# Patient Record
Sex: Female | Born: 1975 | Race: Black or African American | Marital: Married | Smoking: Never smoker
Health system: Southern US, Community
[De-identification: ages and names within clinical notes are randomized; demographics above are authoritative.]

## PROBLEM LIST (undated history)

## (undated) DIAGNOSIS — I10 Essential (primary) hypertension: Secondary | ICD-10-CM

## (undated) DIAGNOSIS — E119 Type 2 diabetes mellitus without complications: Secondary | ICD-10-CM

---

## 2018-01-28 ENCOUNTER — Emergency Department
Admission: EM | Admit: 2018-01-28 | Discharge: 2018-01-28 | Disposition: A | Discharge status: Home / Self Care | Payer: Self-pay | Attending: Emergency Medicine | Admitting: Emergency Medicine

## 2018-01-28 ENCOUNTER — Other Ambulatory Visit: Payer: Self-pay

## 2018-01-28 ENCOUNTER — Encounter: Payer: Self-pay | Admitting: Emergency Medicine

## 2018-01-28 ENCOUNTER — Emergency Department: Payer: Self-pay

## 2018-01-28 DIAGNOSIS — Z880 Allergy status to penicillin: Secondary | ICD-10-CM | POA: Insufficient documentation

## 2018-01-28 DIAGNOSIS — R1032 Left lower quadrant pain: Secondary | ICD-10-CM | POA: Insufficient documentation

## 2018-01-28 DIAGNOSIS — R112 Nausea with vomiting, unspecified: Secondary | ICD-10-CM | POA: Insufficient documentation

## 2018-01-28 LAB — BASIC METABOLIC PANEL
Anion gap: 9 (ref 5–15)
BUN: 7 mg/dL (ref 6–20)
CO2: 26 mmol/L (ref 22–32)
Calcium: 9.7 mg/dL (ref 8.9–10.3)
Chloride: 101 mmol/L (ref 101–111)
Creatinine, Ser: 0.75 mg/dL (ref 0.44–1.00)
GFR calc Af Amer: >60 mL/min (ref >60)
Glucose, Bld: 204 mg/dL — ABNORMAL HIGH (ref 65–99)
POTASSIUM: 4.1 mmol/L (ref 3.5–5.1)
SODIUM: 136 mmol/L (ref 135–145)

## 2018-01-28 LAB — URINALYSIS, COMPLETE (UACMP) WITH MICROSCOPIC
BACTERIA UA: NONE SEEN
Bilirubin Urine: NEGATIVE
Glucose, UA: 50 mg/dL — AB
HGB URINE DIPSTICK: NEGATIVE
Ketones, ur: NEGATIVE mg/dL
Leukocytes, UA: NEGATIVE
NITRITE: NEGATIVE
PROTEIN: NEGATIVE mg/dL
Specific Gravity, Urine: 1.016 (ref 1.005–1.030)
pH: 6 (ref 5.0–8.0)

## 2018-01-28 LAB — CBC
HEMATOCRIT: 45.3 % (ref 35.0–47.0)
Hemoglobin: 15.2 g/dL (ref 12.0–16.0)
MCH: 30.4 pg (ref 26.0–34.0)
MCHC: 33.6 g/dL (ref 32.0–36.0)
MCV: 90.6 fL (ref 80.0–100.0)
PLATELETS: 266 10*3/uL (ref 150–440)
RBC: 5 MIL/uL (ref 3.80–5.20)
RDW: 13.8 % (ref 11.5–14.5)
WBC: 4.9 10*3/uL (ref 3.6–11.0)

## 2018-01-28 MED ORDER — MORPHINE SULFATE (PF) 4 MG/ML IV SOLN
INTRAVENOUS | Status: AC
  Administered 2018-01-28: 4 mg via INTRAVENOUS
  Filled 2018-01-28: qty 1

## 2018-01-28 MED ORDER — ONDANSETRON HCL 4 MG/2ML IJ SOLN
4.0000 mg | Freq: Once | INTRAMUSCULAR | Status: AC
  Administered 2018-01-28: 4 mg via INTRAVENOUS
  Filled 2018-01-28: qty 2

## 2018-01-28 MED ORDER — SODIUM CHLORIDE 0.9 % IV SOLN
1000.0000 mL | Freq: Once | INTRAVENOUS | Status: AC
  Administered 2018-01-28: 1000 mL via INTRAVENOUS

## 2018-01-28 MED ORDER — KETOROLAC TROMETHAMINE 30 MG/ML IJ SOLN
30.0000 mg | Freq: Once | INTRAMUSCULAR | Status: AC
  Administered 2018-01-28: 30 mg via INTRAVENOUS
  Filled 2018-01-28: qty 1

## 2018-01-28 MED ORDER — FENTANYL CITRATE (PF) 100 MCG/2ML IJ SOLN
50.0000 ug | Freq: Once | INTRAMUSCULAR | Status: AC
  Administered 2018-01-28: 50 ug via INTRAVENOUS

## 2018-01-28 MED ORDER — ONDANSETRON HCL 4 MG/2ML IJ SOLN
4.0000 mg | Freq: Once | INTRAMUSCULAR | Status: AC
  Administered 2018-01-28: 4 mg via INTRAVENOUS

## 2018-01-28 MED ORDER — TRAMADOL HCL 50 MG PO TABS: 50.0000 mg | ORAL_TABLET | Freq: Four times a day (QID) | ORAL | 0 refills | Status: DC | PRN

## 2018-01-28 MED ORDER — MORPHINE SULFATE (PF) 4 MG/ML IV SOLN
4.0000 mg | Freq: Once | INTRAVENOUS | Status: AC
  Administered 2018-01-28: 4 mg via INTRAVENOUS

## 2018-01-28 MED ORDER — ONDANSETRON HCL 4 MG/2ML IJ SOLN
INTRAMUSCULAR | Status: AC
  Filled 2018-01-28: qty 2

## 2018-01-28 MED ORDER — FENTANYL CITRATE (PF) 100 MCG/2ML IJ SOLN
INTRAMUSCULAR | Status: AC
  Administered 2018-01-28: 50 ug via INTRAVENOUS
  Filled 2018-01-28: qty 2

## 2018-01-28 MED ORDER — SODIUM CHLORIDE 0.9 % IV BOLUS (SEPSIS)
1000.0000 mL | Freq: Once | INTRAVENOUS | Status: AC
  Administered 2018-01-28: 1000 mL via INTRAVENOUS

## 2018-01-28 NOTE — ED Triage Notes (Signed)
Pt reports history of kidney stones reports since yesterday left flank pain, hematuria. Reports been taking ibuprofen no help with pain. Been drinking fluids, no denies vomiting just nausea.

## 2018-01-28 NOTE — ED Provider Notes (Signed)
[facility] Emergency Department Provider Note   ____________________________________________    I have reviewed the triage vital signs and the nursing notes.   HISTORY  Chief Complaint Flank Pain     HPI Penny Carroll is a 42 y.o. female who presents with complaints of left flank pain which began yesterday but it was mild, today while she was getting ready for church became much more severe cramping pain and she developed nausea and vomiting.  She does report a history of kidney stones but that was over 8 years ago.  Denies fevers or chills.  No dysuria.  Did notice some hematuria today.  Has not taken anything for this  History reviewed. No pertinent past medical history.  There are no active problems to display for this patient.   History reviewed. No pertinent surgical history.  Prior to Admission medications   Medication Sig Start Date End Date Taking? Authorizing Provider  traMADol (ULTRAM) 50 MG tablet Take 1 tablet (50 mg total) by mouth every 6 (six) hours as needed. 01/28/18 01/28/19  Jene EveryKinner, [place]as[place] Yepiz, MD     Allergies Penicillins and Reglan [metoclopramide]  No family history on file.  Social History Social History   Tobacco Use  . Smoking status: Not on file  Substance Use Topics  . Alcohol use: No    Frequency: Never  . Drug use: No    Review of Systems  Constitutional: No fever/chills Eyes: No visual changes.  ENT: No sore throat. Cardiovascular: Denies chest pain. Respiratory: Denies shortness of breath. Gastrointestinal: Left flank pain, nausea and vomiting as above Genitourinary: Negative for dysuria.  Positive hematuria Musculoskeletal: Negative for back pain. Skin: Negative for rash. Neurological: Negative for headaches   ____________________________________________   PHYSICAL EXAM:  VITAL SIGNS: ED Triage Vitals  Enc Vitals Group     BP 01/28/18 1358 (!) 150/96     Pulse Rate 01/28/18 1358 91     Resp  01/28/18 1358 18     Temp 01/28/18 1358 98.3 F (36.8 C)     Temp Source 01/28/18 1358 Oral     SpO2 01/28/18 1358 100 %     Weight 01/28/18 1427 95.3 kg (210 lb)     Height 01/28/18 1427 1.651 m (5\' 5" )     Head Circumference --      Peak Flow --      Pain Score 01/28/18 1427 10     Pain Loc --      Pain Edu? --      Excl. in GC? --     Constitutional: Alert and oriented.  Eyes: Conjunctivae are normal.   Nose: No congestion/rhinnorhea. Mouth/Throat: Mucous membranes are moist.    Cardiovascular: Normal rate, regular rhythm. Grossly normal heart sounds.  Good peripheral circulation. Respiratory: Normal respiratory effort.  No retractions. Lungs CTAB. Gastrointestinal: Mild tenderness to palpation in the left lower quadrant. No distention.  No CVA tenderness. Genitourinary: deferred Musculoskeletal:  Warm and well perfused Neurologic:  Normal speech and language. No gross focal neurologic deficits are appreciated.  Skin:  Skin is warm, dry and intact. No rash noted. Psychiatric: Mood and affect are normal. Speech and behavior are normal.  ____________________________________________   LABS (all labs ordered are listed, but only abnormal results are displayed)  Labs Reviewed  URINALYSIS, COMPLETE (UACMP) WITH MICROSCOPIC - Abnormal; Notable for the following components:      Result Value   Color, Urine YELLOW (*)    APPearance CLEAR (*)  Glucose, UA 50 (*)    Squamous Epithelial / LPF 0-5 (*)    All other components within normal limits  BASIC METABOLIC PANEL - Abnormal; Notable for the following components:   Glucose, Bld 204 (*)    All other components within normal limits  CBC   ____________________________________________  EKG  None ____________________________________________  RADIOLOGY  CT renal stone study shows no kidney stone, no diverticulitis ____________________________________________   PROCEDURES  Procedure(s) performed:  No  Procedures   Critical Care performed: No ____________________________________________   INITIAL IMPRESSION / ASSESSMENT AND PLAN / ED COURSE  Pertinent labs & imaging results that were available during my care of the patient were reviewed by me and considered in my medical decision making (see chart for details).  Patient presents with left flank pain with reports of hematuria.  Differential diagnosis includes ureterolithiasis, UTI, pyelonephritis, diverticulitis.  We will give IV Toradol, check labs, give IV Zofran and obtain CT renal stone study  Patient continued to have pain after Toradol, given IV morphine x2  ----------------------------------------- 5:26 PM on 01/28/2018 -----------------------------------------  Patient reports brief improvement with morphine for about 10-15 minutes and then her pain returns.  Will try IV fentanyl, discussed with her admission for abdominal pain.  Discussed CT results including incidental findings and the need for close follow-up of these.  After the IV fentanyl patient felt significant better and opted to go home.  She knows she can return anytime if her pain worsens    ____________________________________________   FINAL CLINICAL IMPRESSION(S) / ED DIAGNOSES  Final diagnoses:  Left lower quadrant pain        Note:  This document was prepared using Dragon voice recognition software and may include unintentional dictation errors.    Jene Every, MD 01/28/18 2019

## 2018-01-28 NOTE — ED Notes (Signed)
E sign pad did not capture, patient verbalized understanding.

## 2018-01-28 NOTE — ED Notes (Signed)
Patient given cracker and peanut butter and ginger ale per MD. Patient ambulatory to restroom to obtain urine sample.

## 2018-01-30 ENCOUNTER — Other Ambulatory Visit: Payer: Self-pay

## 2018-01-30 ENCOUNTER — Inpatient Hospital Stay
Admission: EM | Admit: 2018-01-30 | Discharge: 2018-02-03 | DRG: 392 | Disposition: A | Discharge status: Home / Self Care | Payer: Self-pay | Attending: Internal Medicine | Admitting: Internal Medicine

## 2018-01-30 ENCOUNTER — Encounter: Payer: Self-pay | Admitting: Emergency Medicine

## 2018-01-30 ENCOUNTER — Emergency Department: Payer: Self-pay

## 2018-01-30 DIAGNOSIS — K5900 Constipation, unspecified: Secondary | ICD-10-CM | POA: Diagnosis present

## 2018-01-30 DIAGNOSIS — R319 Hematuria, unspecified: Secondary | ICD-10-CM | POA: Diagnosis present

## 2018-01-30 DIAGNOSIS — M79604 Pain in right leg: Secondary | ICD-10-CM | POA: Diagnosis not present

## 2018-01-30 DIAGNOSIS — E785 Hyperlipidemia, unspecified: Secondary | ICD-10-CM | POA: Diagnosis present

## 2018-01-30 DIAGNOSIS — R109 Unspecified abdominal pain: Secondary | ICD-10-CM | POA: Diagnosis present

## 2018-01-30 DIAGNOSIS — I10 Essential (primary) hypertension: Secondary | ICD-10-CM | POA: Diagnosis present

## 2018-01-30 DIAGNOSIS — E669 Obesity, unspecified: Secondary | ICD-10-CM | POA: Diagnosis present

## 2018-01-30 DIAGNOSIS — Z880 Allergy status to penicillin: Secondary | ICD-10-CM

## 2018-01-30 DIAGNOSIS — R1012 Left upper quadrant pain: Principal | ICD-10-CM

## 2018-01-30 DIAGNOSIS — K589 Irritable bowel syndrome without diarrhea: Secondary | ICD-10-CM | POA: Diagnosis present

## 2018-01-30 DIAGNOSIS — K640 First degree hemorrhoids: Secondary | ICD-10-CM | POA: Diagnosis present

## 2018-01-30 DIAGNOSIS — F17210 Nicotine dependence, cigarettes, uncomplicated: Secondary | ICD-10-CM | POA: Diagnosis present

## 2018-01-30 DIAGNOSIS — E119 Type 2 diabetes mellitus without complications: Secondary | ICD-10-CM | POA: Diagnosis present

## 2018-01-30 DIAGNOSIS — R10A2 Flank pain, left side: Secondary | ICD-10-CM | POA: Diagnosis present

## 2018-01-30 DIAGNOSIS — Z794 Long term (current) use of insulin: Secondary | ICD-10-CM

## 2018-01-30 DIAGNOSIS — Z888 Allergy status to other drugs, medicaments and biological substances status: Secondary | ICD-10-CM

## 2018-01-30 DIAGNOSIS — Z6834 Body mass index (BMI) 34.0-34.9, adult: Secondary | ICD-10-CM

## 2018-01-30 HISTORY — DX: Type 2 diabetes mellitus without complications: E11.9

## 2018-01-30 HISTORY — DX: Essential (primary) hypertension: I10

## 2018-01-30 LAB — URINALYSIS, COMPLETE (UACMP) WITH MICROSCOPIC
Bacteria, UA: NONE SEEN
Bilirubin Urine: NEGATIVE
GLUCOSE, UA: NEGATIVE mg/dL
Hgb urine dipstick: NEGATIVE
Ketones, ur: NEGATIVE mg/dL
Leukocytes, UA: NEGATIVE
Nitrite: NEGATIVE
PROTEIN: NEGATIVE mg/dL
Specific Gravity, Urine: 1.009 (ref 1.005–1.030)
pH: 6 (ref 5.0–8.0)

## 2018-01-30 LAB — LIPASE, BLOOD: Lipase: 19 U/L (ref 11–51)

## 2018-01-30 LAB — COMPREHENSIVE METABOLIC PANEL
ALBUMIN: 3.9 g/dL (ref 3.5–5.0)
ALK PHOS: 77 U/L (ref 38–126)
ALT: 22 U/L (ref 14–54)
AST: 24 U/L (ref 15–41)
Anion gap: 8 (ref 5–15)
BILIRUBIN TOTAL: 0.6 mg/dL (ref 0.3–1.2)
BUN: 7 mg/dL (ref 6–20)
CALCIUM: 9 mg/dL (ref 8.9–10.3)
CO2: 28 mmol/L (ref 22–32)
Chloride: 101 mmol/L (ref 101–111)
Creatinine, Ser: 0.77 mg/dL (ref 0.44–1.00)
GFR calc Af Amer: >60 mL/min (ref >60)
GFR calc non Af Amer: >60 mL/min (ref >60)
GLUCOSE: 175 mg/dL — AB (ref 65–99)
Potassium: 3.7 mmol/L (ref 3.5–5.1)
Sodium: 137 mmol/L (ref 135–145)
TOTAL PROTEIN: 7.3 g/dL (ref 6.5–8.1)

## 2018-01-30 LAB — CBC
HEMATOCRIT: 37 % (ref 35.0–47.0)
HEMOGLOBIN: 12.6 g/dL (ref 12.0–16.0)
MCH: 30.9 pg (ref 26.0–34.0)
MCHC: 34.2 g/dL (ref 32.0–36.0)
MCV: 90.3 fL (ref 80.0–100.0)
Platelets: 200 10*3/uL (ref 150–440)
RBC: 4.1 MIL/uL (ref 3.80–5.20)
RDW: 13.2 % (ref 11.5–14.5)
WBC: 4.1 10*3/uL (ref 3.6–11.0)

## 2018-01-30 MED ORDER — IOPAMIDOL (ISOVUE-300) INJECTION 61%
30.0000 mL | Freq: Once | INTRAVENOUS | Status: AC | PRN
  Administered 2018-01-30: 30 mL via ORAL
  Filled 2018-01-30: qty 30

## 2018-01-30 MED ORDER — HYDROMORPHONE HCL 1 MG/ML IJ SOLN
1.0000 mg | Freq: Once | INTRAMUSCULAR | Status: AC
  Administered 2018-01-30: 1 mg via INTRAVENOUS
  Filled 2018-01-30: qty 1

## 2018-01-30 MED ORDER — MORPHINE SULFATE (PF) 4 MG/ML IV SOLN
4.0000 mg | Freq: Once | INTRAVENOUS | Status: AC
  Administered 2018-01-30: 4 mg via INTRAVENOUS
  Filled 2018-01-30: qty 1

## 2018-01-30 MED ORDER — GI COCKTAIL ~~LOC~~
ORAL | Status: AC
  Administered 2018-01-30: 30 mL via ORAL
  Filled 2018-01-30: qty 30

## 2018-01-30 MED ORDER — IOPAMIDOL (ISOVUE-370) INJECTION 76%
100.0000 mL | Freq: Once | INTRAVENOUS | Status: AC | PRN
  Administered 2018-01-30: 100 mL via INTRAVENOUS
  Filled 2018-01-30: qty 100

## 2018-01-30 MED ORDER — KETOROLAC TROMETHAMINE 30 MG/ML IJ SOLN
30.0000 mg | Freq: Once | INTRAMUSCULAR | Status: AC
  Administered 2018-01-30: 30 mg via INTRAVENOUS

## 2018-01-30 MED ORDER — ONDANSETRON HCL 4 MG/2ML IJ SOLN
4.0000 mg | Freq: Once | INTRAMUSCULAR | Status: AC
  Administered 2018-01-30: 4 mg via INTRAVENOUS
  Filled 2018-01-30: qty 2

## 2018-01-30 MED ORDER — KETOROLAC TROMETHAMINE 30 MG/ML IJ SOLN
INTRAMUSCULAR | Status: AC
  Filled 2018-01-30: qty 1

## 2018-01-30 MED ORDER — GI COCKTAIL ~~LOC~~
30.0000 mL | Freq: Once | ORAL | Status: AC
  Administered 2018-01-30: 30 mL via ORAL

## 2018-01-30 MED ORDER — SODIUM CHLORIDE 0.9 % IV BOLUS (SEPSIS)
1000.0000 mL | Freq: Once | INTRAVENOUS | Status: AC
  Administered 2018-01-30: 1000 mL via INTRAVENOUS

## 2018-01-30 NOTE — ED Provider Notes (Signed)
[facility] Emergency Department Provider Note  Time seen: 5:45 PM  I have reviewed the triage vital signs and the nursing notes.   HISTORY  Chief Complaint Hematuria    HPI Penny Carroll is a 42 y.o. female who presents to the emergency department for 2-3 days of worsening left-sided abdominal pain.  According to the patient for the past 2 days she has been experiencing severe left abdominal pain which has progressively worsened.  Patient was seen in the emergency department 2 days ago for the same had an overall normal workup including a noncontrasted CT and lab work.  Did have incidental findings on the CT.  States the pain has progressively worsened since being discharged home, she is very nauseated but denies any vomiting.  Denies diarrhea, last bowel movement was this morning.  States low-grade fever of 99 at home.  States she noticed some blood in her urine but denies any pain with urination denies vaginal bleeding, denies black or bloody stool.   History reviewed. No pertinent past medical history.  There are no active problems to display for this patient.   History reviewed. No pertinent surgical history.  Prior to Admission medications   Medication Sig Start Date End Date Taking? Authorizing Provider  traMADol (ULTRAM) 50 MG tablet Take 1 tablet (50 mg total) by mouth every 6 (six) hours as needed. 01/28/18 01/28/19  Jene Every, MD    Allergies  Allergen Reactions  . Penicillins   . Reglan [Metoclopramide]     No family history on file.  Social History Social History   Tobacco Use  . Smoking status: Never Smoker  . Smokeless tobacco: Never Used  Substance Use Topics  . Alcohol use: No    Frequency: Never  . Drug use: No    Review of Systems Constitutional: States low-grade fever at home Eyes: Negative for visual complaints ENT: Negative for recent illness/congestion Cardiovascular: Negative for chest pain. Respiratory: Negative for  shortness of breath. Gastrointestinal: Significant left-sided abdominal pain, nausea but no vomiting or diarrhea.  No black or bloody stool. Genitourinary: States had noted some blood in her urine today but only when urinating, no vaginal bleeding, no dysuria. Musculoskeletal: Negative for musculoskeletal complaints Skin: Negative for skin complaints  Neurological: Negative for headache All other ROS negative  ____________________________________________   PHYSICAL EXAM:  VITAL SIGNS: ED Triage Vitals  Enc Vitals Group     BP 01/30/18 1551 (!) 150/118     Pulse Rate 01/30/18 1551 93     Resp 01/30/18 1551 20     Temp 01/30/18 1551 98.8 F (37.1 C)     Temp Source 01/30/18 1551 Oral     SpO2 01/30/18 1551 99 %     Weight --      Height --      Head Circumference --      Peak Flow --      Pain Score 01/30/18 1552 4     Pain Loc --      Pain Edu? --      Excl. in GC? --     Constitutional: Alert and oriented.  Mild distress due to abdominal pain, holding her abdomen. Eyes: Normal exam ENT   Head: Normocephalic and atraumatic   Mouth/Throat: Mucous membranes are moist. Cardiovascular: Normal rate, regular rhythm. No murmur Respiratory: Normal respiratory effort without tachypnea nor retractions. Breath sounds are clear  Gastrointestinal: Soft, moderate tenderness to palpation left upper and lower quadrants.  No rebound or guarding.  No distention. Musculoskeletal: Nontender with normal range of motion in all extremities.  Neurologic:  Normal speech and language. No gross focal neurologic deficits  Skin:  Skin is warm, dry and intact.  Psychiatric: Mood and affect are normal.   ____________________________________________     RADIOLOGY  CT scan essentially normal.  ____________________________________________   INITIAL IMPRESSION / ASSESSMENT AND PLAN / ED COURSE  Pertinent labs & imaging results that were available during my care of the patient were  reviewed by me and considered in my medical decision making (see chart for details).  Patient presents the emergency department for left-sided abdominal pain worsening over the past 2-3 days.  Was seen in the emergency department 2 days ago for the same with a largely normal workup besides incidental findings.  Differential would include colitis, diverticulitis, ureterolithiasis, pyelonephritis.  We will recheck labs today in the emergency department, repeat a urinalysis, obtain a contrasted oral and IV CT scan to further evaluate.  We will treat pain and nausea while awaiting results.  Patient agreeable to this plan of care.  Labs have resulted within normal limits.  CT scan is normal.  Patient continues to state severe pain.  I repeated my abdominal exam all of her pain is in the left upper quadrant to left mid abdomen.  I do not have a clear cause for the patient's discomfort she does continue to appear uncomfortable.  I reviewed the patient's narcotic database, only one prescription filled recently for tramadol.  We will attempt a GI cocktail for symptom relief for possible gastritis although the patient is quite tender to palpation.  It is reassuring that the patient's labs are normal given her pain is been ongoing for 3 days but I do not have a clear cause for the patient's discomfort at this time.  Patient continues with significant pain despite GI cocktail.  No clear cause for the pain but continues to be severely uncomfortable.  We will admit to the hospitalist service for further treatment and monitoring.  ____________________________________________   FINAL CLINICAL IMPRESSION(S) / ED DIAGNOSES  Left-sided abdominal pain    Minna AntisPaduchowski, Alekxander Isola, MD 01/30/18 2228

## 2018-01-30 NOTE — ED Triage Notes (Signed)
Pt with blood in  Her urine and left side flank pain.

## 2018-01-31 ENCOUNTER — Inpatient Hospital Stay: Payer: Self-pay

## 2018-01-31 ENCOUNTER — Other Ambulatory Visit: Payer: Self-pay

## 2018-01-31 DIAGNOSIS — R195 Other fecal abnormalities: Secondary | ICD-10-CM

## 2018-01-31 LAB — HEMOGLOBIN A1C
Hgb A1c MFr Bld: 10.8 % — ABNORMAL HIGH (ref 4.8–5.6)
Mean Plasma Glucose: 263.26 mg/dL

## 2018-01-31 LAB — CBC
HEMATOCRIT: 39.1 % (ref 35.0–47.0)
HEMOGLOBIN: 11.6 g/dL — AB (ref 12.0–16.0)
MCH: 27.5 pg (ref 26.0–34.0)
MCHC: 29.6 g/dL — AB (ref 32.0–36.0)
MCV: 92.7 fL (ref 80.0–100.0)
Platelets: 171 10*3/uL (ref 150–440)
RBC: 4.22 MIL/uL (ref 3.80–5.20)
RDW: 13.6 % (ref 11.5–14.5)
WBC: 3.3 10*3/uL — ABNORMAL LOW (ref 3.6–11.0)

## 2018-01-31 LAB — GLUCOSE, CAPILLARY
GLUCOSE-CAPILLARY: 235 mg/dL — AB (ref 65–99)
GLUCOSE-CAPILLARY: 179 mg/dL — AB (ref 65–99)
GLUCOSE-CAPILLARY: 215 mg/dL — AB (ref 65–99)
Glucose-Capillary: 211 mg/dL — ABNORMAL HIGH (ref 65–99)
Glucose-Capillary: 241 mg/dL — ABNORMAL HIGH (ref 65–99)

## 2018-01-31 LAB — BASIC METABOLIC PANEL
ANION GAP: 10 (ref 5–15)
BUN: 8 mg/dL (ref 6–20)
CO2: 22 mmol/L (ref 22–32)
Calcium: 8.5 mg/dL — ABNORMAL LOW (ref 8.9–10.3)
Chloride: 105 mmol/L (ref 101–111)
Creatinine, Ser: 0.74 mg/dL (ref 0.44–1.00)
GFR calc Af Amer: >60 mL/min (ref >60)
GLUCOSE: 271 mg/dL — AB (ref 65–99)
Potassium: 3.6 mmol/L (ref 3.5–5.1)
Sodium: 137 mmol/L (ref 135–145)

## 2018-01-31 LAB — FERRITIN: Ferritin: 71 ng/mL (ref 11–307)

## 2018-01-31 LAB — FOLATE: Folate: 12.5 ng/mL (ref >5.9)

## 2018-01-31 LAB — IRON AND TIBC
Iron: 51 ug/dL (ref 28–170)
SATURATION RATIOS: 14 % (ref 10.4–31.8)
TIBC: 373 ug/dL (ref 250–450)
UIBC: 322 ug/dL

## 2018-01-31 LAB — RETICULOCYTES
RBC.: 4.59 MIL/uL (ref 3.80–5.20)
RETIC CT PCT: 1.1 % (ref 0.4–3.1)
Retic Count, Absolute: 50.5 10*3/uL (ref 19.0–183.0)

## 2018-01-31 MED ORDER — LORAZEPAM 2 MG/ML IJ SOLN
2.0000 mg | Freq: Once | INTRAMUSCULAR | Status: AC
  Administered 2018-01-31: 11:00:00 2 mg via INTRAVENOUS
  Filled 2018-01-31: qty 1

## 2018-01-31 MED ORDER — ONDANSETRON HCL 4 MG PO TABS: 4.0000 mg | ORAL_TABLET | Freq: Four times a day (QID) | ORAL | Status: DC | PRN

## 2018-01-31 MED ORDER — DOCUSATE SODIUM 100 MG PO CAPS
100.0000 mg | ORAL_CAPSULE | Freq: Two times a day (BID) | ORAL | Status: DC
  Administered 2018-01-31 (×2): 100 mg via ORAL
  Filled 2018-01-31 (×5): qty 1

## 2018-01-31 MED ORDER — ACETAMINOPHEN 325 MG PO TABS: 650.0000 mg | ORAL_TABLET | Freq: Four times a day (QID) | ORAL | Status: DC | PRN

## 2018-01-31 MED ORDER — ONDANSETRON HCL 4 MG/2ML IJ SOLN
4.0000 mg | Freq: Four times a day (QID) | INTRAMUSCULAR | Status: DC | PRN
Start: 2018-01-31 — End: 2018-02-03
  Administered 2018-02-01: 18:00:00 4 mg via INTRAVENOUS
  Filled 2018-01-31: qty 2

## 2018-01-31 MED ORDER — MORPHINE SULFATE (PF) 2 MG/ML IV SOLN
2.0000 mg | Freq: Once | INTRAVENOUS | Status: AC
  Administered 2018-01-31: 2 mg via INTRAVENOUS
  Filled 2018-01-31: qty 1

## 2018-01-31 MED ORDER — PANTOPRAZOLE SODIUM 40 MG PO TBEC
40.0000 mg | DELAYED_RELEASE_TABLET | Freq: Two times a day (BID) | ORAL | Status: DC
  Administered 2018-01-31 – 2018-02-02 (×3): 40 mg via ORAL
  Filled 2018-01-31 (×3): qty 1

## 2018-01-31 MED ORDER — TRAZODONE HCL 50 MG PO TABS
25.0000 mg | ORAL_TABLET | Freq: Every evening | ORAL | Status: DC | PRN
  Filled 2018-01-31: qty 1

## 2018-01-31 MED ORDER — SODIUM CHLORIDE 0.9 % IV SOLN
Freq: Once | INTRAVENOUS | Status: AC
  Administered 2018-01-31: 01:00:00 via INTRAVENOUS

## 2018-01-31 MED ORDER — HYDROCODONE-ACETAMINOPHEN 5-325 MG PO TABS
1.0000 | ORAL_TABLET | ORAL | Status: DC | PRN
  Administered 2018-01-31: 2 via ORAL
  Filled 2018-01-31: qty 2

## 2018-01-31 MED ORDER — PANTOPRAZOLE SODIUM 40 MG PO TBEC
40.0000 mg | DELAYED_RELEASE_TABLET | Freq: Every day | ORAL | Status: DC
  Administered 2018-01-31: 40 mg via ORAL
  Filled 2018-01-31: qty 1

## 2018-01-31 MED ORDER — MORPHINE SULFATE (PF) 2 MG/ML IV SOLN
2.0000 mg | INTRAVENOUS | Status: DC | PRN
  Administered 2018-01-31 (×2): 2 mg via INTRAVENOUS
  Filled 2018-01-31 (×2): qty 1

## 2018-01-31 MED ORDER — INSULIN ASPART 100 UNIT/ML ~~LOC~~ SOLN
0.0000 [IU] | Freq: Three times a day (TID) | SUBCUTANEOUS | Status: DC
  Administered 2018-01-31: 13:00:00 3 [IU] via SUBCUTANEOUS
  Administered 2018-01-31: 2 [IU] via SUBCUTANEOUS
  Administered 2018-01-31 – 2018-02-02 (×3): 3 [IU] via SUBCUTANEOUS
  Administered 2018-02-02: 5 [IU] via SUBCUTANEOUS
  Administered 2018-02-03: 2 [IU] via SUBCUTANEOUS
  Filled 2018-01-31 (×9): qty 1

## 2018-01-31 MED ORDER — HEPARIN SODIUM (PORCINE) 5000 UNIT/ML IJ SOLN
5000.0000 [IU] | Freq: Three times a day (TID) | INTRAMUSCULAR | Status: DC
  Administered 2018-01-31 – 2018-02-02 (×6): 5000 [IU] via SUBCUTANEOUS
  Filled 2018-01-31 (×7): qty 1

## 2018-01-31 MED ORDER — ACETAMINOPHEN 650 MG RE SUPP: 650.0000 mg | Freq: Four times a day (QID) | RECTAL | Status: DC | PRN

## 2018-01-31 MED ORDER — BISACODYL 5 MG PO TBEC: 5.0000 mg | DELAYED_RELEASE_TABLET | Freq: Every day | ORAL | Status: DC | PRN

## 2018-01-31 MED ORDER — INSULIN ASPART 100 UNIT/ML ~~LOC~~ SOLN
0.0000 [IU] | Freq: Every day | SUBCUTANEOUS | Status: DC
  Administered 2018-01-31: 2 [IU] via SUBCUTANEOUS
  Administered 2018-02-01: 3 [IU] via SUBCUTANEOUS
  Filled 2018-01-31 (×2): qty 1

## 2018-01-31 MED ORDER — HYDROMORPHONE HCL 1 MG/ML IJ SOLN
0.5000 mg | INTRAMUSCULAR | Status: DC | PRN
  Administered 2018-01-31 – 2018-02-01 (×7): 0.5 mg via INTRAVENOUS
  Filled 2018-01-31 (×7): qty 1

## 2018-01-31 MED ORDER — KETOROLAC TROMETHAMINE 30 MG/ML IJ SOLN: 30.0000 mg | Freq: Three times a day (TID) | INTRAMUSCULAR | Status: DC | PRN

## 2018-01-31 NOTE — Progress Notes (Signed)
[facility] [facility] [place] [facility]   PATIENT NAME: Penny Carroll    MR#:  [id number]  DATE OF BIRTH:  Aug 15, 1976  SUBJECTIVE:  CHIEF COMPLAINT:   Chief Complaint  Patient presents with  . Hematuria   -Patient came in with 2 day history of left-sided abdominal pain with nausea -Still complains of significant pain  REVIEW OF SYSTEMS:  Review of Systems  Constitutional: Positive for malaise/fatigue. Negative for chills and fever.  HENT: Negative for congestion, ear discharge, hearing loss and nosebleeds.   Eyes: Negative for blurred vision and double vision.  Respiratory: Negative for cough, shortness of breath and wheezing.   Cardiovascular: Negative for chest pain and palpitations.  Gastrointestinal: Positive for abdominal pain and nausea. Negative for constipation, diarrhea and vomiting.  Genitourinary: Negative for dysuria.  Musculoskeletal: Negative for myalgias.  Neurological: Negative for dizziness, speech change, focal weakness, seizures and headaches.  Psychiatric/Behavioral: Negative for depression.    DRUG ALLERGIES:   Allergies  Allergen Reactions  . Penicillins   . Reglan [Metoclopramide]     VITALS:  Blood pressure 121/88, pulse 74, temperature 97.8 F (36.6 C), temperature source Oral, resp. rate 12, height 5\' 5"  (1.651 m), weight 94.3 kg (207 lb 14.4 oz), SpO2 100 %.  PHYSICAL EXAMINATION:  Physical Exam  GENERAL:  42 y.o.-year-old patient lying in the bed with no acute distress.  EYES: Pupils equal, round, reactive to light and accommodation. No scleral icterus. Extraocular muscles intact.  HEENT: Head atraumatic, normocephalic. Oropharynx and nasopharynx clear.  NECK:  Supple, no jugular venous distention. No thyroid enlargement, no tenderness.  LUNGS: Normal breath sounds bilaterally, no wheezing, rales,rhonchi or crepitation. No use of accessory muscles of respiration.  CARDIOVASCULAR: S1, S2 normal. No murmurs, rubs, or gallops.    ABDOMEN: Soft, tender to touch in the left flank, left mid abdomen-voluntary guarding but no rigidity or rebound tenderness, nondistended. Bowel sounds present. No organomegaly or mass.  EXTREMITIES: No pedal edema, cyanosis, or clubbing.  NEUROLOGIC: Cranial nerves II through XII are intact. Muscle strength 5/5 in all extremities. Sensation intact. Gait not checked.  PSYCHIATRIC: The patient is alert and oriented x 3.  SKIN: No obvious rash, lesion, or ulcer.    LABORATORY PANEL:   CBC Recent Labs  Lab 01/31/18 0358  WBC 3.3*  HGB 11.6*  HCT 39.1  PLT 171   ------------------------------------------------------------------------------------------------------------------  Chemistries  Recent Labs  Lab 01/30/18 1745 01/31/18 0358  NA 137 137  K 3.7 3.6  CL 101 105  CO2 28 22  GLUCOSE 175* 271*  BUN 7 8  CREATININE 0.77 0.74  CALCIUM 9.0 8.5*  AST 24  --   ALT 22  --   ALKPHOS 77  --   BILITOT 0.6  --    ------------------------------------------------------------------------------------------------------------------  Cardiac Enzymes No results for input(s): TROPONINI in the last 168 hours. ------------------------------------------------------------------------------------------------------------------  RADIOLOGY:  Ct Abdomen Pelvis W Wo Contrast  Result Date: 01/30/2018 CLINICAL DATA:  Left-sided flank pain.  Hematuria. EXAM: CT ABDOMEN AND PELVIS WITHOUT AND WITH CONTRAST TECHNIQUE: Multidetector CT imaging of the abdomen and pelvis was performed following the standard protocol before and following the bolus administration of intravenous contrast. CONTRAST:  ISOVUE-370 IOPAMIDOL (ISOVUE-370) INJECTION 76% COMPARISON:  01/28/2018 FINDINGS: Lower chest: No acute abnormality. 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 No other liver masses or lesions. Liver normal in size and attenuation. Gallbladder surgically absent.  No  bile duct  dilation. Pancreas: Unremarkable. No pancreatic ductal dilatation or surrounding inflammatory changes. Spleen: Normal in size without focal abnormality. Adrenals/Urinary Tract: No adrenal masses. Kidneys are normal size, orientation and position. There is symmetric renal enhancement excretion. No renal masses or stones. No hydronephrosis. Ureters normal in course and in caliber. Bladder is minimally distended.  No bladder mass or wall thickening. Stomach/Bowel: Stomach and small bowel are unremarkable. No colonic distention, wall thickening or inflammation. Surgical staples are noted along the medial cecal tip consistent with a prior appendectomy. Vascular/Lymphatic: Aorta is normal in caliber. No significant atherosclerosis. No dissection. Major branch vessels are widely patent. No enlarged lymph nodes. Reproductive: Heterogeneous attenuation the uterus. Uterus is normal in overall size. Contour bulge noted from the uterine fundus consistent with a small subserosal fibroid. No adnexal masses. Other: There has been a previous low anterior midline incision. A fat containing umbilical hernia is noted. No bowel enters this. There are no other hernias. No ascites. Musculoskeletal: No acute or significant osseous findings. IMPRESSION: 1. No acute findings within the abdomen or pelvis. Specifically, no findings to account for left flank pain or hematuria. 2. Previously seen low-density mass at the dome of the right liver lobe is a hemangioma. No further evaluation indicated. 3. No renal or ureteral stones. No hydronephrosis. No renal masses. Bladder is only minimally distended. No evidence of a bladder mass or significant wall thickening. 4. Small uterine fibroids. 5. Fat containing umbilical hernia. Electronically Signed   By: Amie Portlandavid  Ormond M.D.   On: 01/30/2018 20:40   Mr Thoracic Spine Wo Contrast  Result Date: 01/31/2018 CLINICAL DATA:  Acute intractable left upper quadrant pain. Radiculopathy. EXAM: MRI  THORACIC SPINE WITHOUT CONTRAST TECHNIQUE: Multiplanar, multisequence MR imaging of the thoracic spine was performed. No intravenous contrast was administered. COMPARISON:  CT scan of the abdomen dated 01/30/2018 FINDINGS: Alignment:  Physiologic. Vertebrae: No fracture, evidence of discitis, or bone lesion. Cord:  Normal signal and morphology. Paraspinal and other soft tissues: Negative. Disc levels: T1-2 through T6-7: Normal. T7-8: Slight disc desiccation with a tiny broad-based disc bulge with no neural impingement. T8-9: Disc desiccation. Tiny central disc bulge with no neural impingement. T9-10: Disc desiccation. Tiny disc protrusion just to the right of midline without neural impingement. T10-11 through T12-L1: Normal. No spinal or foraminal stenosis. No appreciable facet joint disease. IMPRESSION: Slight degenerative disc disease at T7-8 through T9-10 without neural impingement. Otherwise negative thoracic MRI. Electronically Signed   By: Francene BoyersJames  Maxwell M.D.   On: 01/31/2018 12:32    EKG:  No orders found for this or any previous visit.  ASSESSMENT AND PLAN:   42 year old female with past medical history significant for diabetes, hypertension and hyperlipidemia presents to hospital secondary to abdominal pain  1. Left-sided abdominal pain-unknown cause at this time. -Nothing on inspection, tender abdomen on exam, CT of the abdomen with no acute findings. No obstruction. -GI has been consulted. -Continue regular diet if she can tolerate -IV fluids and pain medications. Lipase is within normal limits. -The thoracic spine MRI was ordered to rule out any neurological causes but the pain is very localized and would not explain.  2. Diabetes mellitus-check A1c. Currently only on sliding scale insulin  3. Hypertension-well controlled blood pressure without any medications. Monitor  4. DVT prophylaxis-subcutaneous heparin   All the records are reviewed and case discussed with Care  Management/Social Workerr. Management plans discussed with the patient, family and they are in agreement.  CODE STATUS: Full Code  TOTAL TIME TAKING  CARE OF THIS PATIENT: 38 minutes.   POSSIBLE D/C IN 1-2 DAYS, DEPENDING ON CLINICAL CONDITION.   Brayton Baumgartner M.D on 01/31/2018 at 12:36 PM  Between 7am to 6pm - Pager - [phone]  After 6pm go to [email or web address] - password [email or web address]  [facility]  Office  [phone]  CC: Primary care physician; System, Pcp Not In

## 2018-01-31 NOTE — ED Notes (Signed)
Pt taken to floor via stretcher. Report called to RN. All questions answered.

## 2018-01-31 NOTE — Consult Note (Addendum)
Penny Darby, MD [address]  [place]  [place], [place] [address]  Main: [phone]  Fax: [phone] Pager: [phone]   Consultation  Referring Provider:     No ref. provider found Primary Care Physician:  System, Pcp Not In Primary Gastroenterologist:  Dr. Sherri Sear         Reason for Consultation:     Left upper quadrant pain  Date of Admission:  01/30/2018 Date of Consultation:  01/31/2018         HPI:   Breiona Carroll is a 42 y.o. African-American female with history of obesity, uncontrolled diabetes on insulin who presents with about 5 days history of left upper quadrant pain associated with nausea and nonbloody emesis. She reports that pain started on Saturday, she had 3 episodes of emesis. Lying on left side and on back makes pain worse. She is also noticing black stools since the onset of pain. Other symptoms include early satiety, bloating. Prior to this, patient reports she was having brown bowel movements. In last 3-4 weeks, patient has lost about 4 pounds in the process of attending funeral of her mom who just passed away. Her hemoglobin on admission was 15.2, dropped to 11.6 today. However, all cell counts also dropped most likely secondary to hemodilution. She did not have elevated BUN at the time of admission. She is currently being managed for diabetes. She has smoked for 25+ years, currently smoking. Denies alcohol use She had CT A/P and renal stone protocol on admission which was unremarkable. Lipase and LFTs were normal. She started on Protonix daily, received GI cocktail, and pain medication  NSAIDs: None  Antiplts/Anticoagulants/Anti thrombotics: None  GI Procedures: None Mother with history of colon cancer around age 41, passed away at age 78  Past Medical History:  Diagnosis Date  . Diabetes mellitus without complication ([place])   . Hypertension     History reviewed. No pertinent surgical history.  Prior to Admission medications     Medication Sig Start Date End Date Taking? Authorizing Provider  traMADol (ULTRAM) 50 MG tablet Take 1 tablet (50 mg total) by mouth every 6 (six) hours as needed. 01/28/18 01/28/19 Yes Lavonia Drafts, MD    No family history on file.   Social History   Tobacco Use  . Smoking status: Never Smoker  . Smokeless tobacco: Never Used  Substance Use Topics  . Alcohol use: No    Frequency: Never  . Drug use: No    Allergies as of 01/30/2018 - Review Complete 01/30/2018  Allergen Reaction Noted  . Penicillins  01/28/2018  . Reglan [metoclopramide]  01/28/2018    Review of Systems:    All systems reviewed and negative except where noted in HPI.   Physical Exam:  Vital signs in last 24 hours: Temp:  [97.8 F (36.6 C)-98.4 F (36.9 C)] 98.2 F (36.8 C) (02/06 1355) Pulse Rate:  [57-91] 81 (02/06 1355) Resp:  [12-20] 18 (02/06 1355) BP: (111-155)/(76-122) 112/76 (02/06 1355) SpO2:  [97 %-100 %] 100 % (02/06 1355) Weight:  [207 lb 14.4 oz (94.3 kg)] 207 lb 14.4 oz (94.3 kg) (02/06 0049) Last BM Date: 01/30/18 General:   Pleasant, cooperative in NAD Head:  Normocephalic and atraumatic. Eyes:   No icterus.   Conjunctiva pink. PERRLA. Ears:  Normal auditory acuity. Neck:  Supple; no masses or thyroidomegaly Lungs: Respirations even and unlabored. Lungs clear to auscultation bilaterally.   No wheezes, crackles, or rhonchi.  Heart:  Regular rate  and rhythm;  Without murmur, clicks, rubs or gallops Abdomen:  Soft, nondistended, moderate left upper quadrant tenderness. Normal bowel sounds. No appreciable masses or hepatomegaly.  No rebound or guarding.  Rectal:  Not performed. Msk:  Symmetrical without gross deformities.  Strength normal  Extremities:  Without edema, cyanosis or clubbing. Neurologic:  Alert and oriented x3;  grossly normal neurologically. Skin:  Intact without significant lesions or rashes. Cervical Nodes:  No significant cervical adenopathy. Psych:  Alert and  cooperative. Normal affect.  LAB RESULTS: CBC Latest Ref Rng & Units 01/31/2018 01/30/2018 01/28/2018  WBC 3.6 - 11.0 K/uL 3.3(L) 4.1 4.9  Hemoglobin 12.0 - 16.0 g/dL 11.6(L) 12.6 15.2  Hematocrit 35.0 - 47.0 % 39.1 37.0 45.3  Platelets 150 - 440 K/uL 171 200 266    BMET BMP Latest Ref Rng & Units 01/31/2018 01/30/2018 01/28/2018  Glucose 65 - 99 mg/dL 271(H) 175(H) 204(H)  BUN 6 - 20 mg/dL '8 7 7  ' Creatinine 0.44 - 1.00 mg/dL 0.74 0.77 0.75  Sodium 135 - 145 mmol/L 137 137 136  Potassium 3.5 - 5.1 mmol/L 3.6 3.7 4.1  Chloride 101 - 111 mmol/L 105 101 101  CO2 22 - 32 mmol/L '22 28 26  ' Calcium 8.9 - 10.3 mg/dL 8.5(L) 9.0 9.7    LFT Hepatic Function Latest Ref Rng & Units 01/30/2018  Total Protein 6.5 - 8.1 g/dL 7.3  Albumin 3.5 - 5.0 g/dL 3.9  AST 15 - 41 U/L 24  ALT 14 - 54 U/L 22  Alk Phosphatase 38 - 126 U/L 77  Total Bilirubin 0.3 - 1.2 mg/dL 0.6     STUDIES: Ct Abdomen Pelvis W Wo Contrast  Result Date: 01/30/2018 CLINICAL DATA:  Left-sided flank pain.  Hematuria. EXAM: CT ABDOMEN AND PELVIS WITHOUT AND WITH CONTRAST TECHNIQUE: Multidetector CT imaging of the abdomen and pelvis was performed following the standard protocol before and following the bolus administration of intravenous contrast. CONTRAST:  115m ISOVUE-370 IOPAMIDOL (ISOVUE-370) INJECTION 76% COMPARISON:  01/28/2018 FINDINGS: Lower chest: No acute abnormality. 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 No other liver masses or lesions. Liver normal in size and attenuation. Gallbladder surgically absent.  No bile duct dilation. Pancreas: Unremarkable. No pancreatic ductal dilatation or surrounding inflammatory changes. Spleen: Normal in size without focal abnormality. Adrenals/Urinary Tract: No adrenal masses. Kidneys are normal size, orientation and position. There is symmetric renal enhancement excretion. No renal masses or stones. No hydronephrosis. Ureters normal in course and in  caliber. Bladder is minimally distended.  No bladder mass or wall thickening. Stomach/Bowel: Stomach and small bowel are unremarkable. No colonic distention, wall thickening or inflammation. Surgical staples are noted along the medial cecal tip consistent with a prior appendectomy. Vascular/Lymphatic: Aorta is normal in caliber. No significant atherosclerosis. No dissection. Major branch vessels are widely patent. No enlarged lymph nodes. Reproductive: Heterogeneous attenuation the uterus. Uterus is normal in overall size. Contour bulge noted from the uterine fundus consistent with a small subserosal fibroid. No adnexal masses. Other: There has been a previous low anterior midline incision. A fat containing umbilical hernia is noted. No bowel enters this. There are no other hernias. No ascites. Musculoskeletal: No acute or significant osseous findings. IMPRESSION: 1. No acute findings within the abdomen or pelvis. Specifically, no findings to account for left flank pain or hematuria. 2. Previously seen low-density mass at the dome of the right liver lobe is a hemangioma. No further evaluation indicated. 3. No  renal or ureteral stones. No hydronephrosis. No renal masses. Bladder is only minimally distended. No evidence of a bladder mass or significant wall thickening. 4. Small uterine fibroids. 5. Fat containing umbilical hernia. Electronically Signed   By: Lajean Manes M.D.   On: 01/30/2018 20:40   Mr Thoracic Spine Wo Contrast  Result Date: 01/31/2018 CLINICAL DATA:  Acute intractable left upper quadrant pain. Radiculopathy. EXAM: MRI THORACIC SPINE WITHOUT CONTRAST TECHNIQUE: Multiplanar, multisequence MR imaging of the thoracic spine was performed. No intravenous contrast was administered. COMPARISON:  CT scan of the abdomen dated 01/30/2018 FINDINGS: Alignment:  Physiologic. Vertebrae: No fracture, evidence of discitis, or bone lesion. Cord:  Normal signal and morphology. Paraspinal and other soft tissues:  Negative. Disc levels: T1-2 through T6-7: Normal. T7-8: Slight disc desiccation with a tiny broad-based disc bulge with no neural impingement. T8-9: Disc desiccation. Tiny central disc bulge with no neural impingement. T9-10: Disc desiccation. Tiny disc protrusion just to the right of midline without neural impingement. T10-11 through T12-L1: Normal. No spinal or foraminal stenosis. No appreciable facet joint disease. IMPRESSION: Slight degenerative disc disease at T7-8 through T9-10 without neural impingement. Otherwise negative thoracic MRI. Electronically Signed   By: Lorriane Shire M.D.   On: 01/31/2018 12:32      Impression / Plan:   Lucia Harm is a 42 y.o. female with history of insulin-dependent diabetes presents with approximately 5 days history of left upper quadrant pain associated with early satiety, black stools, nausea and emesis. Differentials include peptic ulcer disease or erosive gastropathy or AVMs or proximal colonic bleed or small bowel bleed. She may also have gastroparesis attribute to some of her symptoms with history of uncontrolled diabetes.   - Increase Protonix to 40 mg twice daily - Check ferritin, iron, TIBC, B12 - Nothing by mouth past midnight - EGD tomorrow - If EGD is unremarkable, recommend colonoscopy  I have discussed alternative options, risks & benefits,  which include, but are not limited to, bleeding, infection, perforation,respiratory complication & drug reaction.  The patient agrees with this plan & written consent will be obtained.    Thank you for involving me in the care of this patient.      LOS: 1 day   Sherri Sear, MD  01/31/2018, 6:25 PM   Note: This dictation was prepared with Dragon dictation along with smaller phrase technology. Any transcriptional errors that result from this process are unintentional.

## 2018-01-31 NOTE — ED Notes (Signed)
Hospitalist in room to see patient at this time.

## 2018-01-31 NOTE — H&P (Signed)
[facility] [place] [facility]   PATIENT NAME: Penny FlesherJanae Aldaco    MR#:  [id number]  DATE OF BIRTH:  Nov 26, 1976  DATE OF ADMISSION:  01/30/2018  PRIMARY CARE PHYSICIAN: System, Pcp Not In   REQUESTING/REFERRING PHYSICIAN:   CHIEF COMPLAINT:   Chief Complaint  Patient presents with  . Hematuria    HISTORY OF PRESENT ILLNESS: Penny Carroll  is a 42 y.o. female with a known history of diabetes type 2, diet controlled and hypertension.  Patient presented to emergency room for acute onset of left upper quadrant pain, going on for the past 3 days, gradually getting worse.  The pain is constant, severe, sharp and burning in nature; if follows the rib cage.  The pain seems to be positional, worse with walking; ibuprofen high dose helps minimally.  Patient has had some nausea for the past 24 hours, but no vomiting; no fever/chills; no diarrhea or bleeding.  Patient denies any similar episodes in the past.  She does not recall any recent trauma or fall.  Patient was seen in emergency room for the same reason 2 days ago.  She underwent CAT scan with IV contrast to rule out kidney stones.  CAT scan was negative for nephrolithiasis and UA was negative for UTI.  Patient was discharged home with tramadol as needed for pain.  Tramadol did not help and made the patient nauseated, so she returned to emergency room for further evaluation.  This time patient underwent abdominal and pelvis CAT scan with and without contrast, which was unremarkable.  Blood test results are within normal limits, except for mildly elevated glucose level. Patient is admitted for further evaluation and treatment.   PAST MEDICAL HISTORY:   Past Medical History:  Diagnosis Date  . Diabetes mellitus without complication ([facility])   . Hypertension     PAST SURGICAL HISTORY: History reviewed. No pertinent surgical history.  SOCIAL HISTORY:  Social History   Tobacco Use  . Smoking status: Never Smoker  .  Smokeless tobacco: Never Used  Substance Use Topics  . Alcohol use: No    Frequency: Never    FAMILY HISTORY: No family history on file.  DRUG ALLERGIES:  Allergies  Allergen Reactions  . Penicillins   . Reglan [Metoclopramide]     REVIEW OF SYSTEMS:   CONSTITUTIONAL: No fever, fatigue or weakness.  EYES: No blurred or double vision.  EARS, NOSE, AND THROAT: No tinnitus or ear pain.  RESPIRATORY: No cough, shortness of breath, wheezing or hemoptysis.  CARDIOVASCULAR: No chest pain, orthopnea, edema.  GASTROINTESTINAL: No nausea, vomiting, diarrhea or abdominal pain.  GENITOURINARY: No dysuria, hematuria.  ENDOCRINE: No polyuria, nocturia,  HEMATOLOGY: No anemia, easy bruising or bleeding SKIN: No rash or lesion. MUSCULOSKELETAL: No joint pain or arthritis.   NEUROLOGIC: No tingling, numbness, weakness.  PSYCHIATRY: No anxiety or depression.   MEDICATIONS AT HOME:  Prior to Admission medications   Medication Sig Start Date End Date Taking? Authorizing Provider  traMADol (ULTRAM) 50 MG tablet Take 1 tablet (50 mg total) by mouth every 6 (six) hours as needed. 01/28/18 01/28/19 Yes Jene EveryKinner, Robert, MD      PHYSICAL EXAMINATION:   VITAL SIGNS: Blood pressure (!) 151/115, pulse (!) 57, temperature 98.4 F (36.9 C), temperature source Oral, resp. rate 13, height 5\' 5"  (1.651 m), weight 94.3 kg (207 lb 14.4 oz), SpO2 97 %.  GENERAL:  42 y.o.-year-old patient lying in the bed with no acute distress.  EYES: Pupils equal, round,  reactive to light and accommodation. No scleral icterus. Extraocular muscles intact.  HEENT: Head atraumatic, normocephalic. Oropharynx and nasopharynx clear.  NECK:  Supple, no jugular venous distention. No thyroid enlargement, no tenderness.  LUNGS: Normal breath sounds bilaterally, no wheezing, rales,rhonchi or crepitation. No use of accessory muscles of respiration.  CARDIOVASCULAR: S1, S2 normal. No murmurs, rubs, or gallops.  ABDOMEN: Soft, nontender,  nondistended. Bowel sounds present. No organomegaly or mass.  EXTREMITIES: No pedal edema, cyanosis, or clubbing.  NEUROLOGIC: Cranial nerves II through XII are intact. Muscle strength 5/5 in all extremities. Sensation intact. Gait not checked.  PSYCHIATRIC: The patient is alert and oriented x 3.  SKIN: No obvious rash, lesion, or ulcer.   LABORATORY PANEL:   CBC Recent Labs  Lab 01/28/18 1441 01/30/18 1745  WBC 4.9 4.1  HGB 15.2 12.6  HCT 45.3 37.0  PLT 266 200  MCV 90.6 90.3  MCH 30.4 30.9  MCHC 33.6 34.2  RDW 13.8 13.2   ------------------------------------------------------------------------------------------------------------------  Chemistries  Recent Labs  Lab 01/28/18 1441 01/30/18 1745  NA 136 137  K 4.1 3.7  CL 101 101  CO2 26 28  GLUCOSE 204* 175*  BUN 7 7  CREATININE 0.75 0.77  CALCIUM 9.7 9.0  AST  --  24  ALT  --  22  ALKPHOS  --  77  BILITOT  --  0.6   ------------------------------------------------------------------------------------------------------------------ estimated creatinine clearance is 102.9 mL/min (by C-G formula based on SCr of 0.77 mg/dL). ------------------------------------------------------------------------------------------------------------------ No results for input(s): TSH, T4TOTAL, T3FREE, THYROIDAB in the last 72 hours.  Invalid input(s): FREET3   Coagulation profile No results for input(s): INR, PROTIME in the last 168 hours. ------------------------------------------------------------------------------------------------------------------- No results for input(s): DDIMER in the last 72 hours. -------------------------------------------------------------------------------------------------------------------  Cardiac Enzymes No results for input(s): CKMB, TROPONINI, MYOGLOBIN in the last 168 hours.  Invalid input(s):  CK ------------------------------------------------------------------------------------------------------------------ Invalid input(s): POCBNP  ---------------------------------------------------------------------------------------------------------------  Urinalysis    Component Value Date/Time   COLORURINE YELLOW (A) 01/30/2018 1745   APPEARANCEUR CLEAR (A) 01/30/2018 1745   LABSPEC 1.009 01/30/2018 1745   PHURINE 6.0 01/30/2018 1745   GLUCOSEU NEGATIVE 01/30/2018 1745   HGBUR NEGATIVE 01/30/2018 1745   BILIRUBINUR NEGATIVE 01/30/2018 1745   KETONESUR NEGATIVE 01/30/2018 1745   PROTEINUR NEGATIVE 01/30/2018 1745   NITRITE NEGATIVE 01/30/2018 1745   LEUKOCYTESUR NEGATIVE 01/30/2018 1745     RADIOLOGY: Ct Abdomen Pelvis W Wo Contrast  Result Date: 01/30/2018 CLINICAL DATA:  Left-sided flank pain.  Hematuria. EXAM: CT ABDOMEN AND PELVIS WITHOUT AND WITH CONTRAST TECHNIQUE: Multidetector CT imaging of the abdomen and pelvis was performed following the standard protocol before and following the bolus administration of intravenous contrast. CONTRAST:  ISOVUE-370 IOPAMIDOL (ISOVUE-370) INJECTION 76% COMPARISON:  01/28/2018 FINDINGS: Lower chest: No acute abnormality. 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 No other liver masses or lesions. Liver normal in size and attenuation. Gallbladder surgically absent.  No bile duct dilation. Pancreas: Unremarkable. No pancreatic ductal dilatation or surrounding inflammatory changes. Spleen: Normal in size without focal abnormality. Adrenals/Urinary Tract: No adrenal masses. Kidneys are normal size, orientation and position. There is symmetric renal enhancement excretion. No renal masses or stones. No hydronephrosis. Ureters normal in course and in caliber. Bladder is minimally distended.  No bladder mass or wall thickening. Stomach/Bowel: Stomach and small bowel are unremarkable. No colonic distention, wall  thickening or inflammation. Surgical staples are noted along the medial cecal tip consistent with a prior appendectomy.  Vascular/Lymphatic: Aorta is normal in caliber. No significant atherosclerosis. No dissection. Major branch vessels are widely patent. No enlarged lymph nodes. Reproductive: Heterogeneous attenuation the uterus. Uterus is normal in overall size. Contour bulge noted from the uterine fundus consistent with a small subserosal fibroid. No adnexal masses. Other: There has been a previous low anterior midline incision. A fat containing umbilical hernia is noted. No bowel enters this. There are no other hernias. No ascites. Musculoskeletal: No acute or significant osseous findings. IMPRESSION: 1. No acute findings within the abdomen or pelvis. Specifically, no findings to account for left flank pain or hematuria. 2. Previously seen low-density mass at the dome of the right liver lobe is a hemangioma. No further evaluation indicated. 3. No renal or ureteral stones. No hydronephrosis. No renal masses. Bladder is only minimally distended. No evidence of a bladder mass or significant wall thickening. 4. Small uterine fibroids. 5. Fat containing umbilical hernia. Electronically Signed   By: Amie Portland M.D.   On: 01/30/2018 20:40    EKG: No orders found for this or any previous visit.  IMPRESSION AND PLAN:  1.  Acute intractable left upper quadrant pain.  This could be related to thoracic radiculopathy.  Will check MRI of the thoracic spine.  Continue pain control with Toradol and Dilaudid as needed.  We will start Protonix for possible gastritis and will have gastroenterology to further evaluate the patient for other possible intra abdominal etiology. 2.  Diabetes type 2 ,diet controlled.  Continue low-carb diet and monitor blood sugars before meals and at bedtime.`  All the records are reviewed and case discussed with ED provider. Management plans discussed with the patient, family and they are  in agreement.  CODE STATUS:    Code Status Orders  (From admission, onward)        Start     Ordered   01/31/18 0047  Full code  Continuous     01/31/18 0046    Code Status History    Date Active Date Inactive Code Status Order ID Comments User Context   This patient has a current code status but no historical code status.       TOTAL TIME TAKING CARE OF THIS PATIENT: 35 minutes.    Cammy Copa M.D on 01/31/2018 at 3:01 AM  Between 7am to 6pm - Pager - [phone]  After 6pm go to [email or web address] - password [email or web address]  [facility]  Office  [phone]  CC: Primary care physician; System, Pcp Not In

## 2018-01-31 NOTE — Progress Notes (Addendum)
Inpatient Diabetes Program Recommendations  AACE/ADA: New Consensus Statement on Inpatient Glycemic Control (2015)  Target Ranges:  Prepandial:   less than 140 mg/dL      Peak postprandial:   less than 180 mg/dL (1-2 hours)      Critically ill patients:  140 - 180 mg/dL   Lab Results  Component Value Date   GLUCAP 211 (H) 01/31/2018   HGBA1C 10.8 (H) 01/31/2018    Review of Glycemic ControlResults for Penny FlesherWHITE, Penny (MRN [id number]) as of 01/31/2018 14:44  Ref. Range 01/31/2018 07:57 01/31/2018 12:25  Glucose-Capillary Latest Ref Range: 65 - 99 mg/dL 409235 (H) 811211 (H)    Diabetes history: Diet controlled DM Outpatient Diabetes medications: None Current orders for Inpatient glycemic control:  Novolog sensitive tid with meals and HS  Inpatient Diabetes Program Recommendations:    Note admit.  Patient has history of diet controlled DM, however A1C indicates that average blood sugar is 263 mg/dL.  Patient will need DM medications added at d/c.   Currently blood sugars>200 mg/dL.  Please consider adding Lantus 15 units daily while patient is in the hospital.  Text page sent.  Thanks,  Beryl MeagerJenny Laylani Pudwill, RN, BC-ADM Inpatient Diabetes Coordinator Pager [phone] (8a-5p)

## 2018-01-31 NOTE — Progress Notes (Signed)
Patient was transferred from the ER following c/o of left flank pain . On admission patient was A&O X4, ambulatory and endorsed left flank pain.  PRN pain med was admitted twice . Patient and family was oriented to her room and care plan was discussed . Patient slept for most of the night.

## 2018-02-01 ENCOUNTER — Inpatient Hospital Stay: Payer: Self-pay | Admitting: Anesthesiology

## 2018-02-01 ENCOUNTER — Inpatient Hospital Stay: Payer: Self-pay

## 2018-02-01 ENCOUNTER — Encounter
Admission: EM | Disposition: A | Discharge status: Home / Self Care | Payer: Self-pay | Source: Home / Self Care | Attending: Internal Medicine

## 2018-02-01 ENCOUNTER — Encounter: Payer: Self-pay | Admitting: Anesthesiology

## 2018-02-01 DIAGNOSIS — R1012 Left upper quadrant pain: Secondary | ICD-10-CM

## 2018-02-01 HISTORY — PX: ESOPHAGOGASTRODUODENOSCOPY: SHX5428

## 2018-02-01 LAB — BASIC METABOLIC PANEL
Anion gap: 8 (ref 5–15)
BUN: 11 mg/dL (ref 6–20)
CALCIUM: 8.7 mg/dL — AB (ref 8.9–10.3)
CO2: 24 mmol/L (ref 22–32)
Chloride: 104 mmol/L (ref 101–111)
Creatinine, Ser: 0.76 mg/dL (ref 0.44–1.00)
GFR calc Af Amer: >60 mL/min (ref >60)
GLUCOSE: 243 mg/dL — AB (ref 65–99)
POTASSIUM: 3.7 mmol/L (ref 3.5–5.1)
SODIUM: 136 mmol/L (ref 135–145)

## 2018-02-01 LAB — VITAMIN B12: VITAMIN B 12: 470 pg/mL (ref 180–914)

## 2018-02-01 LAB — GLUCOSE, CAPILLARY
GLUCOSE-CAPILLARY: 169 mg/dL — AB (ref 65–99)
GLUCOSE-CAPILLARY: 136 mg/dL — AB (ref 65–99)
Glucose-Capillary: 235 mg/dL — ABNORMAL HIGH (ref 65–99)
Glucose-Capillary: 292 mg/dL — ABNORMAL HIGH (ref 65–99)

## 2018-02-01 LAB — HIV ANTIBODY (ROUTINE TESTING W REFLEX): HIV Screen 4th Generation wRfx: NONREACTIVE

## 2018-02-01 SURGERY — EGD (ESOPHAGOGASTRODUODENOSCOPY)
Anesthesia: General

## 2018-02-01 MED ORDER — SODIUM CHLORIDE 0.9 % IV SOLN
INTRAVENOUS | Status: DC | PRN
  Administered 2018-02-01: 14:00:00 via INTRAVENOUS

## 2018-02-01 MED ORDER — PROPOFOL 10 MG/ML IV BOLUS
INTRAVENOUS | Status: DC | PRN
  Administered 2018-02-01: 70 mg via INTRAVENOUS

## 2018-02-01 MED ORDER — FENTANYL CITRATE (PF) 100 MCG/2ML IJ SOLN
50.0000 ug | Freq: Once | INTRAMUSCULAR | Status: AC
  Administered 2018-02-01: 50 ug via INTRAVENOUS

## 2018-02-01 MED ORDER — PEG 3350-KCL-NA BICARB-NACL 420 G PO SOLR
4000.0000 mL | Freq: Once | ORAL | Status: DC
  Filled 2018-02-01: qty 4000

## 2018-02-01 MED ORDER — INSULIN GLARGINE 100 UNIT/ML ~~LOC~~ SOLN
15.0000 [IU] | Freq: Every day | SUBCUTANEOUS | Status: DC
  Administered 2018-02-01 – 2018-02-02 (×2): 15 [IU] via SUBCUTANEOUS
  Filled 2018-02-01 (×3): qty 0.15

## 2018-02-01 MED ORDER — FLEET ENEMA 7-19 GM/118ML RE ENEM
1.0000 | ENEMA | Freq: Once | RECTAL | Status: AC
  Administered 2018-02-01: 1 via RECTAL

## 2018-02-01 MED ORDER — POLYETHYLENE GLYCOL 3350 17 GM/SCOOP PO POWD
1.0000 | Freq: Once | ORAL | Status: AC
  Administered 2018-02-01: 21:00:00 255 g via ORAL
  Filled 2018-02-01: qty 255

## 2018-02-01 MED ORDER — ONDANSETRON HCL 4 MG/2ML IJ SOLN
4.0000 mg | Freq: Once | INTRAMUSCULAR | Status: AC
  Administered 2018-02-01: 4 mg via INTRAVENOUS

## 2018-02-01 MED ORDER — HYDROMORPHONE HCL 1 MG/ML IJ SOLN
0.5000 mg | Freq: Once | INTRAMUSCULAR | Status: AC
  Administered 2018-02-01: 10:00:00 0.5 mg via INTRAVENOUS
  Filled 2018-02-01: qty 1

## 2018-02-01 MED ORDER — HYDROMORPHONE HCL 1 MG/ML IJ SOLN
0.5000 mg | Freq: Once | INTRAMUSCULAR | Status: AC
  Administered 2018-02-01: 0.5 mg via INTRAVENOUS
  Filled 2018-02-01: qty 1

## 2018-02-01 MED ORDER — IOPAMIDOL (ISOVUE-300) INJECTION 61%
100.0000 mL | Freq: Once | INTRAVENOUS | Status: AC | PRN
  Administered 2018-02-01: 18:00:00 100 mL via INTRAVENOUS

## 2018-02-01 MED ORDER — HYDROMORPHONE HCL 1 MG/ML IJ SOLN
1.0000 mg | INTRAMUSCULAR | Status: DC | PRN
  Administered 2018-02-01 – 2018-02-03 (×12): 1 mg via INTRAVENOUS
  Filled 2018-02-01 (×12): qty 1

## 2018-02-01 MED ORDER — ONDANSETRON HCL 4 MG/2ML IJ SOLN
INTRAMUSCULAR | Status: AC
  Filled 2018-02-01: qty 2

## 2018-02-01 MED ORDER — FENTANYL CITRATE (PF) 100 MCG/2ML IJ SOLN
INTRAMUSCULAR | Status: AC
  Administered 2018-02-01: 50 ug via INTRAVENOUS
  Filled 2018-02-01: qty 2

## 2018-02-01 MED ORDER — PROPOFOL 500 MG/50ML IV EMUL
INTRAVENOUS | Status: DC | PRN
  Administered 2018-02-01: 150 ug/kg/min via INTRAVENOUS

## 2018-02-01 MED ORDER — PROMETHAZINE HCL 25 MG/ML IJ SOLN: 25.0000 mg | Freq: Four times a day (QID) | INTRAMUSCULAR | Status: DC | PRN

## 2018-02-01 NOTE — Progress Notes (Signed)
EGD post procedure note:  Findings: - Normal duodenal bulb and second portion of the duodenum. - Normal stomach. No food or fluid in stomach. Biopsied. - Monilial esophagitis. Biopsied. - Normal proximal esophagus, mid esophagus and gastroesophageal junction.  Recs: - F/u path - PPI BID - Clear liquid diet - Recommend colonoscopy due to ongoing LUQ/flank pain and CT unremarkable - Pt agreeable  Arlyss Repressohini R Vanga, MD [address]  [address]  [place], [place] [address]  Main: [phone]  Fax: [phone] Pager: [phone]

## 2018-02-01 NOTE — Transfer of Care (Signed)
Immediate Anesthesia Transfer of Care Note  Patient: Penny FlesherJanae Mccrackin  Procedure(s) Performed: ESOPHAGOGASTRODUODENOSCOPY (EGD) (N/A )  Patient Location: PACU  Anesthesia Type:General  Level of Consciousness: awake, alert  and oriented  Airway & Oxygen Therapy: Patient Spontanous Breathing and Patient connected to nasal cannula oxygen  Post-op Assessment: Report given to RN and Post -op Vital signs reviewed and stable  Post vital signs: Reviewed and stable  Last Vitals:  Vitals:   02/01/18 1354 02/01/18 1434  BP: (!) 141/96 (!) 127/99  Pulse: 73 (!) 108  Resp: 17 12  Temp: (!) 35.9 C (!) 35.9 C  SpO2: 100% 100%    Last Pain:  Vitals:   02/01/18 1354  TempSrc: Tympanic  PainSc:       Patients Stated Pain Goal: 3 (01/31/18 2056)  Complications: No apparent anesthesia complications

## 2018-02-01 NOTE — Progress Notes (Addendum)
Cephas Darby, MD [address]  [place]  [place], [place] [address]  Main: [phone]  Fax: [phone] Pager: [phone]   Subjective: I was paged from floor that pt is complaining of severe abdominal pain after she returned from EGD. When I went and saw her, she was uncomfortable and tossing in her bed complaining of lot of pain. Dr. Bridgett Larsson, the hospitalist was bedside. Her vitals were stable. Her abdominal exam was benign. Bowel sounds were present  Objective: Vital signs in last 24 hours: Vitals:   02/01/18 1514 02/01/18 1524 02/01/18 1619 02/01/18 1709  BP:   (!) 120/93 (!) 114/91  Pulse: 80 83 77 (!) 104  Resp: 13  18   Temp:   98.6 F (37 C)   TempSrc:      SpO2: 100% 100% 98% 100%  Weight:      Height:       Weight change:   Intake/Output Summary (Last 24 hours) at 02/01/2018 1827 Last data filed at 02/01/2018 1434 Gross per 24 hour  Intake 540 ml  Output -  Net 540 ml     Exam: Heart:: Regular rate and rhythm or S1S2 present Lungs: clear to auscultation Abdomen: soft, mild to moderate tenderness in her left-sided abdomen, normal bowel sounds   Lab Results: CBC Latest Ref Rng & Units 01/31/2018 01/30/2018 01/28/2018  WBC 3.6 - 11.0 K/uL 3.3(L) 4.1 4.9  Hemoglobin 12.0 - 16.0 g/dL 11.6(L) 12.6 15.2  Hematocrit 35.0 - 47.0 % 39.1 37.0 45.3  Platelets 150 - 440 K/uL 171 200 266   BMP Latest Ref Rng & Units 02/01/2018 01/31/2018 01/30/2018  Glucose 65 - 99 mg/dL 243(H) 271(H) 175(H)  BUN 6 - 20 mg/dL '11 8 7  ' Creatinine 0.44 - 1.00 mg/dL 0.76 0.74 0.77  Sodium 135 - 145 mmol/L 136 137 137  Potassium 3.5 - 5.1 mmol/L 3.7 3.6 3.7  Chloride 101 - 111 mmol/L 104 105 101  CO2 22 - 32 mmol/L '24 22 28  ' Calcium 8.9 - 10.3 mg/dL 8.7(L) 8.5(L) 9.0   Hepatic Function Latest Ref Rng & Units 01/30/2018  Total Protein 6.5 - 8.1 g/dL 7.3  Albumin 3.5 - 5.0 g/dL 3.9  AST 15 - 41 U/L 24  ALT 14 - 54 U/L 22  Alk Phosphatase 38 - 126 U/L 77  Total Bilirubin 0.3 - 1.2  mg/dL 0.6   Micro Results: No results found for this or any previous visit (from the past 240 hour(s)). Studies/Results: Ct Abdomen Pelvis W Wo Contrast  Result Date: 01/30/2018 CLINICAL DATA:  Left-sided flank pain.  Hematuria. EXAM: CT ABDOMEN AND PELVIS WITHOUT AND WITH CONTRAST TECHNIQUE: Multidetector CT imaging of the abdomen and pelvis was performed following the standard protocol before and following the bolus administration of intravenous contrast. CONTRAST:  125m ISOVUE-370 IOPAMIDOL (ISOVUE-370) INJECTION 76% COMPARISON:  01/28/2018 FINDINGS: Lower chest: No acute abnormality. 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 No other liver masses or lesions. Liver normal in size and attenuation. Gallbladder surgically absent.  No bile duct dilation. Pancreas: Unremarkable. No pancreatic ductal dilatation or surrounding inflammatory changes. Spleen: Normal in size without focal abnormality. Adrenals/Urinary Tract: No adrenal masses. Kidneys are normal size, orientation and position. There is symmetric renal enhancement excretion. No renal masses or stones. No hydronephrosis. Ureters normal in course and in caliber. Bladder is minimally distended.  No bladder mass or wall thickening. Stomach/Bowel: Stomach and small bowel are unremarkable.  No colonic distention, wall thickening or inflammation. Surgical staples are noted along the medial cecal tip consistent with a prior appendectomy. Vascular/Lymphatic: Aorta is normal in caliber. No significant atherosclerosis. No dissection. Major branch vessels are widely patent. No enlarged lymph nodes. Reproductive: Heterogeneous attenuation the uterus. Uterus is normal in overall size. Contour bulge noted from the uterine fundus consistent with a small subserosal fibroid. No adnexal masses. Other: There has been a previous low anterior midline incision. A fat containing umbilical hernia is noted. No bowel enters this. There  are no other hernias. No ascites. Musculoskeletal: No acute or significant osseous findings. IMPRESSION: 1. No acute findings within the abdomen or pelvis. Specifically, no findings to account for left flank pain or hematuria. 2. Previously seen low-density mass at the dome of the right liver lobe is a hemangioma. No further evaluation indicated. 3. No renal or ureteral stones. No hydronephrosis. No renal masses. Bladder is only minimally distended. No evidence of a bladder mass or significant wall thickening. 4. Small uterine fibroids. 5. Fat containing umbilical hernia. Electronically Signed   By: Lajean Manes M.D.   On: 01/30/2018 20:40   Mr Thoracic Spine Wo Contrast  Result Date: 01/31/2018 CLINICAL DATA:  Acute intractable left upper quadrant pain. Radiculopathy. EXAM: MRI THORACIC SPINE WITHOUT CONTRAST TECHNIQUE: Multiplanar, multisequence MR imaging of the thoracic spine was performed. No intravenous contrast was administered. COMPARISON:  CT scan of the abdomen dated 01/30/2018 FINDINGS: Alignment:  Physiologic. Vertebrae: No fracture, evidence of discitis, or bone lesion. Cord:  Normal signal and morphology. Paraspinal and other soft tissues: Negative. Disc levels: T1-2 through T6-7: Normal. T7-8: Slight disc desiccation with a tiny broad-based disc bulge with no neural impingement. T8-9: Disc desiccation. Tiny central disc bulge with no neural impingement. T9-10: Disc desiccation. Tiny disc protrusion just to the right of midline without neural impingement. T10-11 through T12-L1: Normal. No spinal or foraminal stenosis. No appreciable facet joint disease. IMPRESSION: Slight degenerative disc disease at T7-8 through T9-10 without neural impingement. Otherwise negative thoracic MRI. Electronically Signed   By: Lorriane Shire M.D.   On: 01/31/2018 12:32   Medications: I have reviewed the patient's current medications. Scheduled Meds: . docusate sodium  100 mg Oral BID  . heparin  5,000 Units  Subcutaneous Q8H  . insulin aspart  0-5 Units Subcutaneous QHS  . insulin aspart  0-9 Units Subcutaneous TID WC  . insulin glargine  15 Units Subcutaneous QHS  . ondansetron      . pantoprazole  40 mg Oral BID AC   Continuous Infusions: PRN Meds:.acetaminophen **OR** acetaminophen, bisacodyl, HYDROcodone-acetaminophen, HYDROmorphone (DILAUDID) injection, ondansetron **OR** ondansetron (ZOFRAN) IV, promethazine, traZODone   Assessment: Active Problems:   Left flank pain   Left upper quadrant pain  I reviewed CT films from 01/30/2018 which revealed significant stool burden in her entire colon. Her pain is most likely secondary to severe constipation.  Plan: Stat x-ray KUB Stat CT A/P Nothing by mouth Will follow the results of repeat CT and start bowel regimen Cancel colonoscopy for tomorrow  Addendum: Repeat x-ray and CT did not reveal acute abdomen. There was no evidence of perforation Aggressive bowel regimen  Recommend MiraLAX prep  Will follow along with you    LOS: 2 days   Rohini Vanga 02/01/2018, 6:27 PM

## 2018-02-01 NOTE — Progress Notes (Signed)
Inpatient Diabetes Program Recommendations  AACE/ADA: New Consensus Statement on Inpatient Glycemic Control (2015)  Target Ranges:  Prepandial:   less than 140 mg/dL      Peak postprandial:   less than 180 mg/dL (1-2 hours)      Critically ill patients:  140 - 180 mg/dL   Lab Results  Component Value Date   GLUCAP 235 (H) 02/01/2018   HGBA1C 10.8 (H) 01/31/2018    Review of Glycemic Control  Results for Penny Carroll, [name] (MRN [id number]) as of 02/01/2018 08:51  Ref. Range 01/31/2018 12:25 01/31/2018 16:44 01/31/2018 20:00 01/31/2018 21:34 02/01/2018 07:37  Glucose-Capillary Latest Ref Range: 65 - 99 mg/dL 409211 (H) 811179 (H) 914241 (H) 215 (H) 235 (H)   Diabetes history: Diet controlled DM  Outpatient Diabetes medications: None  Current orders for Inpatient glycemic control: Novolog sensitive tid with meals and HS  Inpatient Diabetes Program Recommendations:     Patient has history of diet controlled DM, however A1C indicates that average blood sugar is 263 mg/dL.  Patient will need DM medications added at d/c.    Currently blood sugars>200 mg/dL.  Please consider adding Lantus 15 units daily while patient is in the hospital.    Susette RacerJulie Ellee Wawrzyniak, RN, BA, MHA, CDE Diabetes Coordinator Inpatient Diabetes Program  [phone] (Team Pager) [phone] [facility] Office) 02/01/2018 8:59 AM

## 2018-02-01 NOTE — Op Note (Signed)
[facility]astroenterology Patient Name: Penny Carroll Procedure Date: 02/01/2018 2:18 PM MRN: [id number] Account #: [id number] Date of Birth: 08-30-1976 Admit Type: Inpatient Age: 42 Room: [facility] ENDO ROOM 4 Gender: Female Note Status: Finalized Procedure:            Upper GI endoscopy Indications:          Abdominal pain in the left upper quadrant Providers:            Lin Landsman MD, MD Medicines:            Monitored Anesthesia Care Complications:        No immediate complications. Estimated blood loss:                        Minimal. Procedure:            Pre-Anesthesia Assessment:                       - Prior to the procedure, a History and Physical was                        performed, and patient medications and allergies were                        reviewed. The patient is competent. The risks and                        benefits of the procedure and the sedation options and                        risks were discussed with the patient. All questions                        were answered and informed consent was obtained.                        Patient identification and proposed procedure were                        verified by the physician, the nurse, the                        anesthesiologist, the anesthetist and the technician in                        the endoscopy suite. Mental Status Examination: alert                        and oriented. Airway Examination: normal oropharyngeal                        airway and neck mobility. Respiratory Examination:                        clear to auscultation. CV Examination: normal.                        Prophylactic Antibiotics: The patient does not require                        prophylactic  antibiotics. Prior Anticoagulants: The                        patient has taken no previous anticoagulant or                        antiplatelet agents. ASA Grade Assessment: II - A                        patient  with mild systemic disease. After reviewing the                        risks and benefits, the patient was deemed in                        satisfactory condition to undergo the procedure. The                        anesthesia plan was to use monitored anesthesia care                        (MAC). Immediately prior to administration of                        medications, the patient was re-assessed for adequacy                        to receive sedatives. The heart rate, respiratory rate,                        oxygen saturations, blood pressure, adequacy of                        pulmonary ventilation, and response to care were                        monitored throughout the procedure. The physical status                        of the patient was re-assessed after the procedure.                       After obtaining informed consent, the endoscope was                        passed under direct vision. Throughout the procedure,                        the patient's blood pressure, pulse, and oxygen                        saturations were monitored continuously.The upper GI                        endoscopy was accomplished without difficulty. The                        patient tolerated the procedure well. The Endoscope was  introduced through the mouth, and advanced to the                        second part of duodenum. The upper GI endoscopy was                        accomplished without difficulty. The patient tolerated                        the procedure well. Findings:      The duodenal bulb and second portion of the duodenum were normal.      The entire examined stomach was normal. Biopsies were taken with a cold       forceps for Helicobacter pylori testing.      The cardia and gastric fundus were normal on retroflexion.      Diffuse candidiasis was found in the lower third of the esophagus.       Biopsies were taken with a cold forceps for histology.      The  proximal esophagus, mid esophagus and gastroesophageal junction were       normal. Impression:           - Normal duodenal bulb and second portion of the                        duodenum.                       - Normal stomach. Biopsied.                       - Monilial esophagitis. Biopsied.                       - Normal proximal esophagus, mid esophagus and                        gastroesophageal junction. Recommendation:       - Await pathology results.                       - Return patient to hospital ward for ongoing care.                       - Clear liquid diet and low residue diet.                       - Continue present medications. Procedure Code(s):    --- Professional ---                       [phone], Esophagogastroduodenoscopy, flexible, transoral;                        with biopsy, single or multiple Diagnosis Code(s):    --- Professional ---                       B37.81, Candidal esophagitis                       R10.12, Left upper quadrant pain CPT copyright 2016 [employer]. All rights reserved. The codes documented in this report are preliminary and upon coder review may  be revised to meet current compliance requirements. Dr. Ulyess Mort Lin Landsman MD, MD 02/01/2018 3:40:48 PM This report has been signed electronically. Number of Addenda: 0 Note Initiated On: 02/01/2018 2:18 PM      [facility]

## 2018-02-01 NOTE — Anesthesia Preprocedure Evaluation (Signed)
Anesthesia Evaluation  Patient identified by MRN, date of birth, ID band Patient awake    Reviewed: Allergy & Precautions, H&P , NPO status , Patient's Chart, lab work & pertinent test results  History of Anesthesia Complications Negative for: history of anesthetic complications  Airway Mallampati: III  TM Distance: >3 FB Neck ROM: full    Dental  (+) Chipped, Poor Dentition   Pulmonary neg shortness of breath, Current Smoker,           Cardiovascular Exercise Tolerance: Good hypertension, (-) angina(-) Past MI and (-) DOE      Neuro/Psych negative neurological ROS  negative psych ROS   GI/Hepatic negative GI ROS, Neg liver ROS, neg GERD  ,  Endo/Other  diabetes, Type 2, Insulin Dependent  Renal/GU negative Renal ROS  negative genitourinary   Musculoskeletal   Abdominal   Peds  Hematology negative hematology ROS (+)   Anesthesia Other Findings Past Medical History: No date: Diabetes mellitus without complication (HCC) No date: Hypertension  History reviewed. No pertinent surgical history.  BMI    Body Mass Index:  34.60 kg/m      Reproductive/Obstetrics negative OB ROS                             Anesthesia Physical  Anesthesia Plan  ASA: III  Anesthesia Plan: General   Post-op Pain Management:    Induction: Intravenous  PONV Risk Score and Plan: Propofol infusion and TIVA  Airway Management Planned: Natural Airway and Nasal Cannula  Additional Equipment:   Intra-op Plan:   Post-operative Plan:   Informed Consent: I have reviewed the patients History and Physical, chart, labs and discussed the procedure including the risks, benefits and alternatives for the proposed anesthesia with the patient or authorized representative who has indicated his/her understanding and acceptance.   Dental Advisory Given  Plan Discussed with: Anesthesiologist, CRNA and  Surgeon  Anesthesia Plan Comments: (Patient consented for risks of anesthesia including but not limited to:  - adverse reactions to medications - risk of intubation if required - damage to teeth, lips or other oral mucosa - sore throat or hoarseness - Damage to heart, brain, lungs or loss of life  Patient voiced understanding.)        Anesthesia Quick Evaluation  

## 2018-02-01 NOTE — Anesthesia Post-op Follow-up Note (Signed)
Anesthesia QCDR form completed.        

## 2018-02-01 NOTE — Progress Notes (Signed)
Patient has no acute event overnight, pain is controlled with PRN pain meds, VSS, and NPO overnight for scheduled EGD in AM. EGD consent signed by patient.

## 2018-02-01 NOTE — Progress Notes (Signed)
[employer] -  at [facility]   PATIENT NAME: Penny Carroll    MR#:  [id number]  DATE OF BIRTH:  [birth year]  SUBJECTIVE:  CHIEF COMPLAINT:   Chief Complaint  Patient presents with  . Hematuria   -Patient came in with 2 day history of left-sided abdominal pain with nausea -Still complains of significant pain. Awaiting EGD when I saw her.  REVIEW OF SYSTEMS:  Review of Systems  Constitutional: Positive for malaise/fatigue. Negative for chills and fever.  HENT: Negative for congestion, ear discharge, hearing loss and nosebleeds.   Eyes: Negative for blurred vision and double vision.  Respiratory: Negative for cough, shortness of breath and wheezing.   Cardiovascular: Negative for chest pain and palpitations.  Gastrointestinal: Positive for abdominal pain and nausea. Negative for constipation, diarrhea and vomiting.  Genitourinary: Negative for dysuria.  Musculoskeletal: Negative for myalgias.  Neurological: Negative for dizziness, speech change, focal weakness, seizures and headaches.  Psychiatric/Behavioral: Negative for depression.    DRUG ALLERGIES:   Allergies  Allergen Reactions  . Penicillins   . Reglan [Metoclopramide]     VITALS:  Blood pressure (!) 114/91, pulse (!) 104, temperature 98.6 F (37 C), resp. rate 18, height 5\' 5"  (1.651 m), weight 94.3 kg (207 lb 14.4 oz), SpO2 100 %.  PHYSICAL EXAMINATION:  Physical Exam  GENERAL:  42 y.o.-year-old patient lying in the bed with no acute distress.  EYES: Pupils equal, round, reactive to light and accommodation. No scleral icterus. Extraocular muscles intact.  HEENT: Head atraumatic, normocephalic. Oropharynx and nasopharynx clear.  NECK:  Supple, no jugular venous distention. No thyroid enlargement, no tenderness.  LUNGS: Normal breath sounds bilaterally, no wheezing, rales,rhonchi or crepitation. No use of accessory muscles of respiration.  CARDIOVASCULAR: S1, S2 normal. No murmurs, rubs,  or gallops.  ABDOMEN: Soft, tender to touch in the left flank, left mid abdomen-voluntary guarding but no rigidity or rebound tenderness, nondistended. Bowel sounds present. No organomegaly or mass.  EXTREMITIES: No pedal edema, cyanosis, or clubbing.  NEUROLOGIC: Cranial nerves II through XII are intact. Muscle strength 5/5 in all extremities. Sensation intact. Gait not checked.  PSYCHIATRIC: The patient is alert and oriented x 3.  SKIN: No obvious rash, lesion, or ulcer.    LABORATORY PANEL:   CBC Recent Labs  Lab 01/31/18 0358  WBC 3.3*  HGB 11.6*  HCT 39.1  PLT 171   ------------------------------------------------------------------------------------------------------------------  Chemistries  Recent Labs  Lab 01/30/18 1745  02/01/18 0609  NA 137   < > 136  K 3.7   < > 3.7  CL 101   < > 104  CO2 28   < > 24  GLUCOSE 175*   < > 243*  BUN 7   < > 11  CREATININE 0.77   < > 0.76  CALCIUM 9.0   < > 8.7*  AST 24  --   --   ALT 22  --   --   ALKPHOS 77  --   --   BILITOT 0.6  --   --    < > = values in this interval not displayed.   ------------------------------------------------------------------------------------------------------------------  Cardiac Enzymes No results for input(s): TROPONINI in the last 168 hours. ------------------------------------------------------------------------------------------------------------------  RADIOLOGY:  Ct Abdomen Pelvis W Wo Contrast  Result Date: 01/30/2018 CLINICAL DATA:  Left-sided flank pain.  Hematuria. EXAM: CT ABDOMEN AND PELVIS WITHOUT AND WITH CONTRAST TECHNIQUE: Multidetector CT imaging of the abdomen and pelvis was performed following the standard protocol before and  following the bolus administration of intravenous contrast. CONTRAST:  ISOVUE-370 IOPAMIDOL (ISOVUE-370) INJECTION 76% COMPARISON:  01/28/2018 FINDINGS: Lower chest: No acute abnormality. Hepatobiliary: 4 cm mass at the dome of the right lobe,  segment 7, is a hemangioma. No other liver masses or lesions. Liver normal in size and attenuation. Gallbladder surgically absent.  No bile duct dilation. Pancreas: Unremarkable. No pancreatic ductal dilatation or surrounding inflammatory changes. Spleen: Normal in size without focal abnormality. Adrenals/Urinary Tract: No adrenal masses. Kidneys are normal size, orientation and position. There is symmetric renal enhancement excretion. No renal masses or stones. No hydronephrosis. Ureters normal in course and in caliber. Bladder is minimally distended.  No bladder mass or wall thickening. Stomach/Bowel: Stomach and small bowel are unremarkable. No colonic distention, wall thickening or inflammation. Surgical staples are noted along the medial cecal tip consistent with a prior appendectomy. Vascular/Lymphatic: Aorta is normal in caliber. No significant atherosclerosis. No dissection. Major branch vessels are widely patent. No enlarged lymph nodes. Reproductive: Heterogeneous attenuation the uterus. Uterus is normal in overall size. Contour bulge noted from the uterine fundus consistent with a small subserosal fibroid. No adnexal masses. Other: There has been a previous low anterior midline incision. A fat containing umbilical hernia is noted. No bowel enters this. There are no other hernias. No ascites. Musculoskeletal: No acute or significant osseous findings. IMPRESSION: 1. No acute findings within the abdomen or pelvis. Specifically, no findings to account for left flank pain or hematuria. 2. Previously seen low-density mass at the dome of the right liver lobe is a hemangioma. No further evaluation indicated. 3. No renal or ureteral stones. No hydronephrosis. No renal masses. Bladder is only minimally distended. No evidence of a bladder mass or significant wall thickening. 4. Small uterine fibroids. 5. Fat containing umbilical hernia. Electronically Signed   By: Amie Portland M.D.   On: 01/30/2018 20:40   Mr  Thoracic Spine Wo Contrast  Result Date: 01/31/2018 CLINICAL DATA:  Acute intractable left upper quadrant pain. Radiculopathy. EXAM: MRI THORACIC SPINE WITHOUT CONTRAST TECHNIQUE: Multiplanar, multisequence MR imaging of the thoracic spine was performed. No intravenous contrast was administered. COMPARISON:  CT scan of the abdomen dated 01/30/2018 FINDINGS: Alignment:  Physiologic. Vertebrae: No fracture, evidence of discitis, or bone lesion. Cord:  Normal signal and morphology. Paraspinal and other soft tissues: Negative. Disc levels: T1-2 through T6-7: Normal. T7-8: Slight disc desiccation with a tiny broad-based disc bulge with no neural impingement. T8-9: Disc desiccation. Tiny central disc bulge with no neural impingement. T9-10: Disc desiccation. Tiny disc protrusion just to the right of midline without neural impingement. T10-11 through T12-L1: Normal. No spinal or foraminal stenosis. No appreciable facet joint disease. IMPRESSION: Slight degenerative disc disease at T7-8 through T9-10 without neural impingement. Otherwise negative thoracic MRI. Electronically Signed   By: Francene Boyers M.D.   On: 01/31/2018 12:32    EKG:  No orders found for this or any previous visit.  ASSESSMENT AND PLAN:   42 year old female with past medical history significant for diabetes, hypertension and hyperlipidemia presents to hospital secondary to abdominal pain  1. Left-sided abdominal pain-unknown cause at this time. -Nothing on inspection, tender abdomen on exam, CT of the abdomen with no acute findings. No obstruction. -GI has been consulted. -Continue regular diet if she can tolerate -IV fluids and pain medications. Lipase is within normal limits. -The thoracic spine MRI was ordered to rule out any neurological causes but the pain is very localized and would not explain. - plan  for EGD today. To r/o gastric ulcer as she was taking nsaids  2. Diabetes mellitus-check A1c. Currently only on sliding scale  insulin  3. Hypertension-well controlled blood pressure without any medications. Monitor  4. DVT prophylaxis-subcutaneous heparin  5. Leg pain   Right leg, no redness, swelling, pulses are good.   Monitor, no further work up needed.  All the records are reviewed and case discussed with Care Management/Social Workerr. Management plans discussed with the patient, family and they are in agreement.  CODE STATUS: Full Code  TOTAL TIME TAKING CARE OF THIS PATIENT: 38 minutes.   POSSIBLE D/C IN 1-2 DAYS, DEPENDING ON CLINICAL CONDITION.   Altamese DillingVaibhavkumar Jade Burkard M.D on 02/01/2018 at 5:12 PM  Between 7am to 6pm - Pager - [phone]  After 6pm go to [email or web address] - password [email or web address]  [facility] [facility]  Office  [phone]  CC: Primary care physician; System, Pcp Not In

## 2018-02-01 NOTE — Consult Note (Signed)
Date of Consultation:  02/01/2018  Requesting Physician:  Altamese Dilling, MD  Reason for Consultation:  Abdominal pain  History of Present Illness: Penny Carroll is a 42 y.o. female admitted on 2/5 with left upper quadrant abdominal pain.  Her pain started about a week ago and just has not gotten better.  Initially when it started it got better but then two days later started again.  She has had nausea and emesis.  She has chronic constipation and has a bowel movement every three days, but that is normal for her and does not feel that the stool is hard.  She has noticed that her stool has become darker in color.  She has had a decrease in her Hgb.  She had an EGD which did not show any bleeding today, and after the procedure she started complaining of severe abdominal pain.  CT scan was obtained and surgery was consulted for evaluation.  Past Medical History: Past Medical History:  Diagnosis Date  . Diabetes mellitus without complication (HCC)   . Hypertension      Past Surgical History: Past Surgical History:  Procedure Laterality Date  . ESOPHAGOGASTRODUODENOSCOPY N/A 02/01/2018   Procedure: ESOPHAGOGASTRODUODENOSCOPY (EGD);  Surgeon: Toney Reil, MD;  Location: [facility] ENDOSCOPY;  Service: Gastroenterology;  Laterality: N/A;  . Partial hysterectomy  . Laparoscopic cholecystectomy    Home Medications: Prior to Admission medications   Medication Sig Start Date End Date Taking? Authorizing Provider  traMADol (ULTRAM) 50 MG tablet Take 1 tablet (50 mg total) by mouth every 6 (six) hours as needed. 01/28/18 01/28/19 Yes Jene Every, MD    Allergies: Allergies  Allergen Reactions  . Penicillins   . Reglan [Metoclopramide]     Social History:  reports that  has never smoked. she has never used smokeless tobacco. She reports that she does not drink alcohol or use drugs.   Family History: --hx of colon cancer (mother)  Review of Systems: Review of Systems   Constitutional: Negative for chills and fever.  HENT: Negative for hearing loss.   Respiratory: Negative for shortness of breath.   Cardiovascular: Negative for chest pain.  Gastrointestinal: Positive for abdominal pain, blood in stool, constipation, nausea and vomiting. Negative for diarrhea.  Genitourinary: Negative for dysuria.  Musculoskeletal: Negative for myalgias.  Skin: Negative for rash.  Neurological: Negative for dizziness.  Psychiatric/Behavioral: Negative for depression.  All other systems reviewed and are negative.   Physical Exam BP (!) 140/96 (BP Location: Right Arm)   Pulse 88   Temp 97.9 F (36.6 C) (Oral)   Resp 13   Ht 5\' 5"  (1.651 m)   Wt 94.3 kg (207 lb 14.4 oz)   SpO2 100%   BMI 34.60 kg/m  CONSTITUTIONAL: No acute distress HEENT:  Normocephalic, atraumatic, extraocular motion intact. NECK: Trachea is midline, and there is no jugular venous distension.  RESPIRATORY:  Lungs are clear, and breath sounds are equal bilaterally. Normal respiratory effort without pathologic use of accessory muscles. CARDIOVASCULAR: Heart is regular without murmurs, gallops, or rubs. GI: The abdomen is soft, mildly distended, with tenderness to palpation over the left upper quadrant.  No peritoneal signs.  Patient has scars from low midline incision from partial hysterectomy and abdominal scars from laparoscopic cholecystectomy.  Has a reducible umbilical hernia. MUSCULOSKELETAL:  Normal muscle strength and tone in all four extremities.  No peripheral edema or cyanosis. SKIN: Skin turgor is normal. There are no pathologic skin lesions.  NEUROLOGIC:  Motor and sensation is grossly normal.  Cranial nerves are grossly intact. PSYCH:  Alert and oriented to person, place and time. Affect is normal.  Laboratory Analysis: Results for orders placed or performed during the hospital encounter of 01/30/18 (from the past 24 hour(s))  Basic metabolic panel     Status: Abnormal   Collection  Time: 02/01/18  6:09 AM  Result Value Ref Range   Sodium 136 135 - 145 mmol/L   Potassium 3.7 3.5 - 5.1 mmol/L   Chloride 104 101 - 111 mmol/L   CO2 24 22 - 32 mmol/L   Glucose, Bld 243 (H) 65 - 99 mg/dL   BUN 11 6 - 20 mg/dL   Creatinine, Ser 4.09 0.44 - 1.00 mg/dL   Calcium 8.7 (L) 8.9 - 10.3 mg/dL   GFR calc non Af Amer >60 >60 mL/min   GFR calc Af Amer >60 >60 mL/min   Anion gap 8 5 - 15  Glucose, capillary     Status: Abnormal   Collection Time: 02/01/18  7:37 AM  Result Value Ref Range   Glucose-Capillary 235 (H) 65 - 99 mg/dL  Glucose, capillary     Status: Abnormal   Collection Time: 02/01/18 11:37 AM  Result Value Ref Range   Glucose-Capillary 169 (H) 65 - 99 mg/dL  Glucose, capillary     Status: Abnormal   Collection Time: 02/01/18  4:49 PM  Result Value Ref Range   Glucose-Capillary 136 (H) 65 - 99 mg/dL  Glucose, capillary     Status: Abnormal   Collection Time: 02/01/18  9:10 PM  Result Value Ref Range   Glucose-Capillary 292 (H) 65 - 99 mg/dL    Imaging: Dg Abd 1 View  Result Date: 02/01/2018 CLINICAL DATA:  Left abdomen pain with nausea. EXAM: ABDOMEN - 1 VIEW COMPARISON:  CT abdomen pelvis January 30, 2018 FINDINGS: The bowel gas pattern is normal. Contrast is identified in the colon and in the collecting system/bladder from recent CT. Prior cholecystectomy clips are noted. No radio-opaque calculi or other significant radiographic abnormality are seen. IMPRESSION: No acute abnormality. Electronically Signed   By: Sherian Rein M.D.   On: 02/01/2018 18:30   Ct Abdomen Pelvis W Contrast  Result Date: 02/01/2018 CLINICAL DATA:  Left-sided abdominal pain and nausea. EXAM: CT ABDOMEN AND PELVIS WITH CONTRAST TECHNIQUE: Multidetector CT imaging of the abdomen and pelvis was performed using the standard protocol following bolus administration of intravenous contrast. CONTRAST:  ISOVUE-300 IOPAMIDOL (ISOVUE-300) INJECTION 61% COMPARISON:  Body CT 01/30/2018 FINDINGS:  Lower chest: Small hiatal hernia. Hepatobiliary: Hypoattenuated lesion in the right dome of the liver with discontinuous peripheral enhancement measures 3.6 cm, and likely represents hemangioma. Postcholecystectomy. No biliary ductal dilation. Pancreas: Unremarkable. No pancreatic ductal dilatation or surrounding inflammatory changes. Spleen: Normal in size without focal abnormality. Adrenals/Urinary Tract: Adrenal glands are unremarkable. Kidneys are normal, without renal calculi, focal lesion, or hydronephrosis. Bladder is unremarkable. Stomach/Bowel: Stomach is within normal limits. Appendix not seen. No evidence of bowel wall thickening, distention, or inflammatory changes. Vascular/Lymphatic: No significant vascular findings are present. No enlarged abdominal or pelvic lymph nodes. Reproductive: Uterus and bilateral adnexa are unremarkable. Other: Fat containing periumbilical anterior abdominal wall hernia. Probable injection sites in the lower anterior abdominal wall. Musculoskeletal: No acute or significant osseous findings. IMPRESSION: No acute abnormalities within the abdomen or pelvis. Small hiatal hernia. Dilation of the distal esophagus may represent dysmotility or gastroesophageal reflux. Fat containing periumbilical anterior abdominal wall hernia. Constipation. Electronically Signed   By: Ulanda Edison.D.  On: 02/01/2018 18:29    Assessment and Plan: This is a 42 y.o. female who presents with left upper quadrant abdominal pain, with severe pain after EGD today.  I have independently viewed the patient's imaging studies and reviewed her laboratory studies.  Overall, her CT is unremarkable for any surgical issues.  There is no perforation, abscess, or stranding, and no hernia defects in the left upper quadrant to account for her pain.  There are no acute surgical needs.  Agree with GI that patient may be constipated given the amount of stool in her colon.  Agree with bowel regimen and  current plan per GI.    Will sign off for now, but please feel free to call or consult again if any other issues or concerns.     Howie IllJose Luis Zeferino Mounts, MD [facility] 7a-7p: [phone] [facility] 7p-7a: [phone]

## 2018-02-01 NOTE — Progress Notes (Signed)
Patient had complained earlier this morning about some right calf pain and warmth.  Dr Claudean KindsVacahanni evaluated patient right leg in room no new orders.

## 2018-02-02 ENCOUNTER — Encounter
Admission: EM | Disposition: A | Discharge status: Home / Self Care | Payer: Self-pay | Source: Home / Self Care | Attending: Internal Medicine

## 2018-02-02 DIAGNOSIS — K5909 Other constipation: Secondary | ICD-10-CM

## 2018-02-02 LAB — GLUCOSE, CAPILLARY
GLUCOSE-CAPILLARY: 170 mg/dL — AB (ref 65–99)
Glucose-Capillary: 238 mg/dL — ABNORMAL HIGH (ref 65–99)
Glucose-Capillary: 281 mg/dL — ABNORMAL HIGH (ref 65–99)
Glucose-Capillary: 172 mg/dL — ABNORMAL HIGH (ref 65–99)

## 2018-02-02 SURGERY — COLONOSCOPY
Anesthesia: General

## 2018-02-02 MED ORDER — LIVING WELL WITH DIABETES BOOK
Freq: Once | Status: AC
  Administered 2018-02-02: 16:00:00
  Filled 2018-02-02: qty 1

## 2018-02-02 MED ORDER — PEG 3350-KCL-NA BICARB-NACL 420 G PO SOLR
4000.0000 mL | Freq: Once | ORAL | Status: AC
  Administered 2018-02-02: 19:00:00 4000 mL via ORAL
  Filled 2018-02-02: qty 4000

## 2018-02-02 MED ORDER — INSULIN STARTER KIT- PEN NEEDLES (ENGLISH)
1.0000 | Freq: Once | Status: AC
  Administered 2018-02-02: 1
  Filled 2018-02-02: qty 1

## 2018-02-02 MED ORDER — HYDROCORTISONE 2.5 % RE CREA
TOPICAL_CREAM | Freq: Three times a day (TID) | RECTAL | Status: DC
  Administered 2018-02-02 – 2018-02-03 (×3): via RECTAL
  Filled 2018-02-02: qty 28.35

## 2018-02-02 NOTE — Progress Notes (Signed)
[facility] [place] [facility]   PATIENT NAME: Penny Carroll    MR#:  [id number]  DATE OF BIRTH:  14-May-1976  SUBJECTIVE:  CHIEF COMPLAINT:   Chief Complaint  Patient presents with  . Hematuria   -Patient came in with 2 day history of left-sided abdominal pain with nausea -Still complains of significant pain. No clear source on EGD, had significant pain last evening and repeat CT and x-ray was done which was still negative. Started on bowel preparation and she had the total 3 bowel movements of our. Feels slightly better but still continued to have on and off pain.Marland Kitchen  REVIEW OF SYSTEMS:  Review of Systems  Constitutional: Positive for malaise/fatigue. Negative for chills and fever.  HENT: Negative for congestion, ear discharge, hearing loss and nosebleeds.   Eyes: Negative for blurred vision and double vision.  Respiratory: Negative for cough, shortness of breath and wheezing.   Cardiovascular: Negative for chest pain and palpitations.  Gastrointestinal: Positive for abdominal pain and nausea. Negative for constipation, diarrhea and vomiting.  Genitourinary: Negative for dysuria.  Musculoskeletal: Negative for myalgias.  Neurological: Negative for dizziness, speech change, focal weakness, seizures and headaches.  Psychiatric/Behavioral: Negative for depression.    DRUG ALLERGIES:   Allergies  Allergen Reactions  . Penicillins   . Reglan [Metoclopramide]     VITALS:  Blood pressure (!) 133/117, pulse 72, temperature 98.2 F (36.8 C), temperature source Oral, resp. rate 18, height 5\' 5"  (1.651 m), weight 97.8 kg (215 lb 8 oz), SpO2 100 %.  PHYSICAL EXAMINATION:  Physical Exam  GENERAL:  42 y.o.-year-old patient lying in the bed with no acute distress.  EYES: Pupils equal, round, reactive to light and accommodation. No scleral icterus. Extraocular muscles intact.  HEENT: Head atraumatic, normocephalic. Oropharynx and nasopharynx clear.  NECK:   Supple, no jugular venous distention. No thyroid enlargement, no tenderness.  LUNGS: Normal breath sounds bilaterally, no wheezing, rales,rhonchi or crepitation. No use of accessory muscles of respiration.  CARDIOVASCULAR: S1, S2 normal. No murmurs, rubs, or gallops.  ABDOMEN: Soft, tender to touch in the left flank, left mid abdomen-voluntary guarding but no rigidity or rebound tenderness, nondistended. Bowel sounds present. No organomegaly or mass.  EXTREMITIES: No pedal edema, cyanosis, or clubbing.  NEUROLOGIC: Cranial nerves II through XII are intact. Muscle strength 5/5 in all extremities. Sensation intact. Gait not checked.  PSYCHIATRIC: The patient is alert and oriented x 3.  SKIN: No obvious rash, lesion, or ulcer.    LABORATORY PANEL:   CBC Recent Labs  Lab 01/31/18 0358  WBC 3.3*  HGB 11.6*  HCT 39.1  PLT 171   ------------------------------------------------------------------------------------------------------------------  Chemistries  Recent Labs  Lab 01/30/18 1745  02/01/18 0609  NA 137   < > 136  K 3.7   < > 3.7  CL 101   < > 104  CO2 28   < > 24  GLUCOSE 175*   < > 243*  BUN 7   < > 11  CREATININE 0.77   < > 0.76  CALCIUM 9.0   < > 8.7*  AST 24  --   --   ALT 22  --   --   ALKPHOS 77  --   --   BILITOT 0.6  --   --    < > = values in this interval not displayed.   ------------------------------------------------------------------------------------------------------------------  Cardiac Enzymes No results for input(s): TROPONINI in the last 168 hours. ------------------------------------------------------------------------------------------------------------------  RADIOLOGY:  Dg  Abd 1 View  Result Date: 02/01/2018 CLINICAL DATA:  Left abdomen pain with nausea. EXAM: ABDOMEN - 1 VIEW COMPARISON:  CT abdomen pelvis January 30, 2018 FINDINGS: The bowel gas pattern is normal. Contrast is identified in the colon and in the collecting system/bladder from  recent CT. Prior cholecystectomy clips are noted. No radio-opaque calculi or other significant radiographic abnormality are seen. IMPRESSION: No acute abnormality. Electronically Signed   By: Sherian ReinWei-Chen  Lin M.D.   On: 02/01/2018 18:30   Ct Abdomen Pelvis W Contrast  Result Date: 02/01/2018 CLINICAL DATA:  Left-sided abdominal pain and nausea. EXAM: CT ABDOMEN AND PELVIS WITH CONTRAST TECHNIQUE: Multidetector CT imaging of the abdomen and pelvis was performed using the standard protocol following bolus administration of intravenous contrast. CONTRAST:  100mL ISOVUE-300 IOPAMIDOL (ISOVUE-300) INJECTION 61% COMPARISON:  Body CT 01/30/2018 FINDINGS: Lower chest: Small hiatal hernia. Hepatobiliary: Hypoattenuated lesion in the right dome of the liver with discontinuous peripheral enhancement measures 3.6 cm, and likely represents hemangioma. Postcholecystectomy. No biliary ductal dilation. Pancreas: Unremarkable. No pancreatic ductal dilatation or surrounding inflammatory changes. Spleen: Normal in size without focal abnormality. Adrenals/Urinary Tract: Adrenal glands are unremarkable. Kidneys are normal, without renal calculi, focal lesion, or hydronephrosis. Bladder is unremarkable. Stomach/Bowel: Stomach is within normal limits. Appendix not seen. No evidence of bowel wall thickening, distention, or inflammatory changes. Vascular/Lymphatic: No significant vascular findings are present. No enlarged abdominal or pelvic lymph nodes. Reproductive: Uterus and bilateral adnexa are unremarkable. Other: Fat containing periumbilical anterior abdominal wall hernia. Probable injection sites in the lower anterior abdominal wall. Musculoskeletal: No acute or significant osseous findings. IMPRESSION: No acute abnormalities within the abdomen or pelvis. Small hiatal hernia. Dilation of the distal esophagus may represent dysmotility or gastroesophageal reflux. Fat containing periumbilical anterior abdominal wall hernia.  Constipation. Electronically Signed   By: Ted Mcalpineobrinka  Dimitrova M.D.   On: 02/01/2018 18:29    EKG:  No orders found for this or any previous visit.  ASSESSMENT AND PLAN:   42 year old female with past medical history significant for diabetes, hypertension and hyperlipidemia presents to hospital secondary to abdominal pain  1. Left-sided abdominal pain-unknown cause at this time. -Nothing on inspection, tender abdomen on exam, CT of the abdomen with no acute findings. No obstruction. -IV fluids and pain medications. Lipase is within normal limits. -The thoracic spine MRI was ordered and negative to rule out any neurological causes but the pain is very localized and would not explain.  - EGD is negative. - Repeat CT scan also done because of persistent complain of severe pain, no other finding except for severe constipation. Given MiraLAX bowel prep and she responded nicely so far, plan for colonoscopy tomorrow.  2. Diabetes mellitus-check A1c. Currently only on sliding scale insulin  3. Hypertension-well controlled blood pressure without any medications. Monitor  4. DVT prophylaxis-subcutaneous heparin  5. Leg pain   Right leg, no redness, swelling, pulses are good.   Monitor, no further work up needed.  All the records are reviewed and case discussed with Care Management/Social Workerr. Management plans discussed with the patient, family and they are in agreement.  CODE STATUS: Full Code  TOTAL TIME TAKING CARE OF THIS PATIENT: 35 minutes.   POSSIBLE D/C IN 1-2 DAYS, DEPENDING ON CLINICAL CONDITION.   Altamese DillingVaibhavkumar Philena Obey M.D on 02/02/2018 at 2:14 PM  Between 7am to 6pm - Pager - [phone]  After 6pm go to [email or web address] - Social research officer, governmentpassword EPAS [facility]  [facility]  Office  [phone]  CC: Primary care physician;  System, Pcp Not In

## 2018-02-02 NOTE — Progress Notes (Signed)
Inpatient Diabetes Program Recommendations  AACE/ADA: New Consensus Statement on Inpatient Glycemic Control (2015)  Target Ranges:  Prepandial:   less than 140 mg/dL      Peak postprandial:   less than 180 mg/dL (1-2 hours)      Critically ill patients:  140 - 180 mg/dL   Lab Results  Component Value Date   GLUCAP 238 (H) 02/02/2018   HGBA1C 10.8 (H) 01/31/2018    Review of Glycemic Control  Results for Penny FlesherWHITE, Safa (MRN [id number]) as of 02/02/2018 10:38  Ref. Range 02/01/2018 07:37 02/01/2018 11:37 02/01/2018 16:49 02/01/2018 21:10 02/02/2018 07:52  Glucose-Capillary Latest Ref Range: 65 - 99 mg/dL 409235 (H) 811169 (H) 914136 (H) 292 (H) 238 (H)    Diabetes history:Diet controlled DM  Outpatient Diabetes medications:None  Current orders for Inpatient glycemic control:Novolog sensitive tid with meals, Novolog 0-5 units qhs, Lantus 15 units qhs  Inpatient Diabetes Program Recommendations:  Patient has history of diet controlled DM, however A1C indicates that average blood sugar is 263 mg/dL. Patient will need DM medications added at d/c.    CBG remain high- Please consider increasing Lantus to 20 units daily while patient is in the hospital.   Susette RacerJulie Egypt Welcome, RN, BA, MHA, CDE Diabetes Coordinator Inpatient Diabetes Program  [phone] (Team Pager) [phone] [facility] Office) 02/02/2018 10:40 AM

## 2018-02-02 NOTE — Progress Notes (Signed)
CH noticed patient was sitting by prayer box. CH stopped and talked with patient. Patient shared anxieties about her colonoscopy tomorrow. Patient was placing a prayer in the prayer box. CH offered prayer to the patient and patient accepted. CH prayed over patient and offered encouragement. CH provided emotional support as patient expressed her concerns. CH asked if patient would like a visit before her procedure in the morning and patient said yes. CH will follow-up in the morning with patient in person or [facility] way [facility].

## 2018-02-02 NOTE — Progress Notes (Signed)
Cephas Darby, MD [address]  [place]  [place], [place] [address]  Main: [phone]  Fax: [phone] Pager: [phone]   Subjective: Feels better today. She is having BMs, loose, nonbloody. Pain is better, on CLD, tolerating well  Objective: Vital signs in last 24 hours: Vitals:   02/02/18 0328 02/02/18 0411 02/02/18 1200 02/02/18 1222  BP: (!) 144/128 (!) 114/59 104/65 (!) 133/117  Pulse: 91 (!) 104 75 72  Resp: 13  18   Temp: 97.9 F (36.6 C)  98.2 F (36.8 C)   TempSrc: Oral  Oral   SpO2: 98%  100%   Weight: 215 lb 8 oz (97.8 kg)     Height:       Weight change:   Intake/Output Summary (Last 24 hours) at 02/02/2018 1417 Last data filed at 02/02/2018 1300 Gross per 24 hour  Intake 780 ml  Output -  Net 780 ml     Exam: Heart:: Regular rate and rhythm or S1S2 present Lungs: clear to auscultation Abdomen: soft, mild tenderness in her left-sided abdomen, normal bowel sounds   Lab Results: CBC Latest Ref Rng & Units 01/31/2018 01/30/2018 01/28/2018  WBC 3.6 - 11.0 K/uL 3.3(L) 4.1 4.9  Hemoglobin 12.0 - 16.0 g/dL 11.6(L) 12.6 15.2  Hematocrit 35.0 - 47.0 % 39.1 37.0 45.3  Platelets 150 - 440 K/uL 171 200 266   BMP Latest Ref Rng & Units 02/01/2018 01/31/2018 01/30/2018  Glucose 65 - 99 mg/dL 243(H) 271(H) 175(H)  BUN 6 - 20 mg/dL _0 Creatinine 0.44 - 1.00 mg/dL 0.76 0.74 0.77  Sodium 135 - 145 mmol/L 136 137 137  Potassium 3.5 - 5.1 mmol/L 3.7 3.6 3.7  Chloride 101 - 111 mmol/L 104 105 101  CO2 22 - 32 mmol/L _1 Calcium 8.9 - 10.3 mg/dL 8.7(L) 8.5(L) 9.0   Hepatic Function Latest Ref Rng & Units 01/30/2018  Total Protein 6.5 - 8.1 g/dL 7.3  Albumin 3.5 - 5.0 g/dL 3.9  AST 15 - 41 U/L 24  ALT 14 - 54 U/L 22  Alk Phosphatase 38 - 126 U/L 77  Total Bilirubin 0.3 - 1.2 mg/dL 0.6   Micro Results: No results found for this or any previous visit (from the past 240 hour(s)). Studies/Results: Dg Abd 1 View  Result Date: 02/01/2018 CLINICAL  DATA:  Left abdomen pain with nausea. EXAM: ABDOMEN - 1 VIEW COMPARISON:  CT abdomen pelvis January 30, 2018 FINDINGS: The bowel gas pattern is normal. Contrast is identified in the colon and in the collecting system/bladder from recent CT. Prior cholecystectomy clips are noted. No radio-opaque calculi or other significant radiographic abnormality are seen. IMPRESSION: No acute abnormality. Electronically Signed   By: Abelardo Diesel M.D.   On: 02/01/2018 18:30   Ct Abdomen Pelvis W Contrast  Result Date: 02/01/2018 CLINICAL DATA:  Left-sided abdominal pain and nausea. EXAM: CT ABDOMEN AND PELVIS WITH CONTRAST TECHNIQUE: Multidetector CT imaging of the abdomen and pelvis was performed using the standard protocol following bolus administration of intravenous contrast. CONTRAST:  186m ISOVUE-300 IOPAMIDOL (ISOVUE-300) INJECTION 61% COMPARISON:  Body CT 01/30/2018 FINDINGS: Lower chest: Small hiatal hernia. Hepatobiliary: Hypoattenuated lesion in the right dome of the liver with discontinuous peripheral enhancement measures 3.6 cm, and likely represents hemangioma. Postcholecystectomy. No biliary ductal dilation. Pancreas: Unremarkable. No pancreatic ductal dilatation or surrounding inflammatory changes. Spleen: Normal in size without focal abnormality. Adrenals/Urinary Tract: Adrenal glands are unremarkable. Kidneys are normal, without renal calculi,  focal lesion, or hydronephrosis. Bladder is unremarkable. Stomach/Bowel: Stomach is within normal limits. Appendix not seen. No evidence of bowel wall thickening, distention, or inflammatory changes. Vascular/Lymphatic: No significant vascular findings are present. No enlarged abdominal or pelvic lymph nodes. Reproductive: Uterus and bilateral adnexa are unremarkable. Other: Fat containing periumbilical anterior abdominal wall hernia. Probable injection sites in the lower anterior abdominal wall. Musculoskeletal: No acute or significant osseous findings. IMPRESSION: No  acute abnormalities within the abdomen or pelvis. Small hiatal hernia. Dilation of the distal esophagus may represent dysmotility or gastroesophageal reflux. Fat containing periumbilical anterior abdominal wall hernia. Constipation. Electronically Signed   By: Fidela Salisbury M.D.   On: 02/01/2018 18:29   Medications: I have reviewed the patient's current medications. Scheduled Meds: . docusate sodium  100 mg Oral BID  . heparin  5,000 Units Subcutaneous Q8H  . insulin aspart  0-5 Units Subcutaneous QHS  . insulin aspart  0-9 Units Subcutaneous TID WC  . insulin glargine  15 Units Subcutaneous QHS  . insulin starter kit- pen needles  1 kit Other Once  . living well with diabetes book   Does not apply Once  . pantoprazole  40 mg Oral BID AC   Continuous Infusions: PRN Meds:.acetaminophen **OR** acetaminophen, bisacodyl, HYDROcodone-acetaminophen, HYDROmorphone (DILAUDID) injection, ondansetron **OR** ondansetron (ZOFRAN) IV, promethazine, traZODone   Assessment: Active Problems:   Left flank pain   Left upper quadrant pain Severe constipation  Plan: Continue bowel regimen  Bowel prep today Plan for colonoscopy tomorrow Nothing by mouth past midnight    LOS: 3 days   Rohini Vanga 02/02/2018, 2:17 PM

## 2018-02-02 NOTE — Progress Notes (Signed)
   02/02/18 0850  Clinical Encounter Type  Visited With Patient  Visit Type Initial  Referral From Nurse  Consult/Referral To Chaplain  Spiritual Encounters  Spiritual Needs Prayer;Emotional   CH received an OR to pray with PT. PT was not feeling well and needed nursing staff support [facility]. CH prayed for PT and will follow up.

## 2018-02-02 NOTE — H&P (View-Only) (Signed)
Cephas Darby, MD [address]  [place]  [place], [place] [address]  Main: [phone]  Fax: [phone] Pager: [phone]   Subjective: Feels better today. She is having BMs, loose, nonbloody. Pain is better, on CLD, tolerating well  Objective: Vital signs in last 24 hours: Vitals:   02/02/18 0328 02/02/18 0411 02/02/18 1200 02/02/18 1222  BP: (!) 144/128 (!) 114/59 104/65 (!) 133/117  Pulse: 91 (!) 104 75 72  Resp: 13  18   Temp: 97.9 F (36.6 C)  98.2 F (36.8 C)   TempSrc: Oral  Oral   SpO2: 98%  100%   Weight: 215 lb 8 oz (97.8 kg)     Height:       Weight change:   Intake/Output Summary (Last 24 hours) at 02/02/2018 1417 Last data filed at 02/02/2018 1300 Gross per 24 hour  Intake 780 ml  Output -  Net 780 ml     Exam: Heart:: Regular rate and rhythm or S1S2 present Lungs: clear to auscultation Abdomen: soft, mild tenderness in her left-sided abdomen, normal bowel sounds   Lab Results: CBC Latest Ref Rng & Units 01/31/2018 01/30/2018 01/28/2018  WBC 3.6 - 11.0 K/uL 3.3(L) 4.1 4.9  Hemoglobin 12.0 - 16.0 g/dL 11.6(L) 12.6 15.2  Hematocrit 35.0 - 47.0 % 39.1 37.0 45.3  Platelets 150 - 440 K/uL 171 200 266   BMP Latest Ref Rng & Units 02/01/2018 01/31/2018 01/30/2018  Glucose 65 - 99 mg/dL 243(H) 271(H) 175(H)  BUN 6 - 20 mg/dL _0 Creatinine 0.44 - 1.00 mg/dL 0.76 0.74 0.77  Sodium 135 - 145 mmol/L 136 137 137  Potassium 3.5 - 5.1 mmol/L 3.7 3.6 3.7  Chloride 101 - 111 mmol/L 104 105 101  CO2 22 - 32 mmol/L _1 Calcium 8.9 - 10.3 mg/dL 8.7(L) 8.5(L) 9.0   Hepatic Function Latest Ref Rng & Units 01/30/2018  Total Protein 6.5 - 8.1 g/dL 7.3  Albumin 3.5 - 5.0 g/dL 3.9  AST 15 - 41 U/L 24  ALT 14 - 54 U/L 22  Alk Phosphatase 38 - 126 U/L 77  Total Bilirubin 0.3 - 1.2 mg/dL 0.6   Micro Results: No results found for this or any previous visit (from the past 240 hour(s)). Studies/Results: Dg Abd 1 View  Result Date: 02/01/2018 CLINICAL  DATA:  Left abdomen pain with nausea. EXAM: ABDOMEN - 1 VIEW COMPARISON:  CT abdomen pelvis January 30, 2018 FINDINGS: The bowel gas pattern is normal. Contrast is identified in the colon and in the collecting system/bladder from recent CT. Prior cholecystectomy clips are noted. No radio-opaque calculi or other significant radiographic abnormality are seen. IMPRESSION: No acute abnormality. Electronically Signed   By: Abelardo Diesel M.D.   On: 02/01/2018 18:30   Ct Abdomen Pelvis W Contrast  Result Date: 02/01/2018 CLINICAL DATA:  Left-sided abdominal pain and nausea. EXAM: CT ABDOMEN AND PELVIS WITH CONTRAST TECHNIQUE: Multidetector CT imaging of the abdomen and pelvis was performed using the standard protocol following bolus administration of intravenous contrast. CONTRAST:  186m ISOVUE-300 IOPAMIDOL (ISOVUE-300) INJECTION 61% COMPARISON:  Body CT 01/30/2018 FINDINGS: Lower chest: Small hiatal hernia. Hepatobiliary: Hypoattenuated lesion in the right dome of the liver with discontinuous peripheral enhancement measures 3.6 cm, and likely represents hemangioma. Postcholecystectomy. No biliary ductal dilation. Pancreas: Unremarkable. No pancreatic ductal dilatation or surrounding inflammatory changes. Spleen: Normal in size without focal abnormality. Adrenals/Urinary Tract: Adrenal glands are unremarkable. Kidneys are normal, without renal calculi,  focal lesion, or hydronephrosis. Bladder is unremarkable. Stomach/Bowel: Stomach is within normal limits. Appendix not seen. No evidence of bowel wall thickening, distention, or inflammatory changes. Vascular/Lymphatic: No significant vascular findings are present. No enlarged abdominal or pelvic lymph nodes. Reproductive: Uterus and bilateral adnexa are unremarkable. Other: Fat containing periumbilical anterior abdominal wall hernia. Probable injection sites in the lower anterior abdominal wall. Musculoskeletal: No acute or significant osseous findings. IMPRESSION: No  acute abnormalities within the abdomen or pelvis. Small hiatal hernia. Dilation of the distal esophagus may represent dysmotility or gastroesophageal reflux. Fat containing periumbilical anterior abdominal wall hernia. Constipation. Electronically Signed   By: Fidela Salisbury M.D.   On: 02/01/2018 18:29   Medications: I have reviewed the patient's current medications. Scheduled Meds: . docusate sodium  100 mg Oral BID  . heparin  5,000 Units Subcutaneous Q8H  . insulin aspart  0-5 Units Subcutaneous QHS  . insulin aspart  0-9 Units Subcutaneous TID WC  . insulin glargine  15 Units Subcutaneous QHS  . insulin starter kit- pen needles  1 kit Other Once  . living well with diabetes book   Does not apply Once  . pantoprazole  40 mg Oral BID AC   Continuous Infusions: PRN Meds:.acetaminophen **OR** acetaminophen, bisacodyl, HYDROcodone-acetaminophen, HYDROmorphone (DILAUDID) injection, ondansetron **OR** ondansetron (ZOFRAN) IV, promethazine, traZODone   Assessment: Active Problems:   Left flank pain   Left upper quadrant pain Severe constipation  Plan: Continue bowel regimen  Bowel prep today Plan for colonoscopy tomorrow Nothing by mouth past midnight    LOS: 3 days   Domingue Coltrain 02/02/2018, 2:17 PM

## 2018-02-02 NOTE — Anesthesia Postprocedure Evaluation (Signed)
Anesthesia Post Note  Patient: Penny Carroll  Procedure(s) Performed: ESOPHAGOGASTRODUODENOSCOPY (EGD) (N/A )  Patient location during evaluation: Endoscopy Anesthesia Type: General Level of consciousness: awake and alert Pain management: pain level controlled Vital Signs Assessment: post-procedure vital signs reviewed and stable Respiratory status: spontaneous breathing, nonlabored ventilation, respiratory function stable and patient connected to nasal cannula oxygen Cardiovascular status: blood pressure returned to baseline and stable Postop Assessment: no apparent nausea or vomiting Anesthetic complications: no     Last Vitals:  Vitals:   02/02/18 0328 02/02/18 0411  BP: (!) 144/128 (!) 114/59  Pulse: 91 (!) 104  Resp: 13   Temp: 36.6 C   SpO2: 98%     Last Pain:  Vitals:   02/02/18 0328  TempSrc: Oral  PainSc:                  Cleda MccreedyJoseph K Piscitello

## 2018-02-02 NOTE — Progress Notes (Signed)
Inpatient Diabetes Program Recommendations  AACE/ADA: New Consensus Statement on Inpatient Glycemic Control (2015)  Target Ranges:  Prepandial:   less than 140 mg/dL      Peak postprandial:   less than 180 mg/dL (1-2 hours)      Critically ill patients:  140 - 180 mg/dL   Lab Results  Component Value Date   GLUCAP 170 (H) 02/02/2018   HGBA1C 10.8 (H) 01/31/2018   Spoke with patient regarding Hg A1C- she reports she has not been to the doctor in over a year and that her doctor no longer practices in the area- [place] in [place]. - she reports she checks blood sugars 2 times per day and blood sugars are around 300 mg/dl.     Despite the medication record stating her diabetes is diet controlled, she reports that she takes Lantus 15 units three times a day and Humalog 2-3 units. I explained the difference between Lantus and Humalog and the odd use of Lantus 3 times a day. Patient tells me she uses an insulin pen for insulin and keeps them in the fridge.  I have encouraged her to only keep the pens she is NOT using in the fridge and to keep the pen she is using on the counter.  Please review with patient, discharge insulin instructions. Did not want to see a dietitian but is open to getting the Living Well with Diabetes book.    Susette RacerJulie Gracelin Weisberg, RN, BA, MHA, CDE Diabetes Coordinator Inpatient Diabetes Program  [phone] (Team Pager) [phone] [facility] Office) 02/02/2018 12:48 PM

## 2018-02-03 ENCOUNTER — Inpatient Hospital Stay: Payer: Self-pay | Admitting: Certified Registered"

## 2018-02-03 ENCOUNTER — Encounter: Payer: Self-pay | Admitting: Anesthesiology

## 2018-02-03 ENCOUNTER — Encounter
Admission: EM | Disposition: A | Discharge status: Home / Self Care | Payer: Self-pay | Source: Home / Self Care | Attending: Internal Medicine

## 2018-02-03 HISTORY — PX: COLONOSCOPY: SHX5424

## 2018-02-03 LAB — CBC
HCT: 37.5 % (ref 35.0–47.0)
Hemoglobin: 12.8 g/dL (ref 12.0–16.0)
MCH: 30.9 pg (ref 26.0–34.0)
MCHC: 34.1 g/dL (ref 32.0–36.0)
MCV: 90.5 fL (ref 80.0–100.0)
PLATELETS: 211 10*3/uL (ref 150–440)
RBC: 4.15 MIL/uL (ref 3.80–5.20)
RDW: 13.5 % (ref 11.5–14.5)
WBC: 4.6 10*3/uL (ref 3.6–11.0)

## 2018-02-03 LAB — GLUCOSE, CAPILLARY
Glucose-Capillary: 178 mg/dL — ABNORMAL HIGH (ref 65–99)
Glucose-Capillary: 155 mg/dL — ABNORMAL HIGH (ref 65–99)

## 2018-02-03 SURGERY — COLONOSCOPY
Anesthesia: General

## 2018-02-03 MED ORDER — METFORMIN HCL 1000 MG PO TABS: 1000.0000 mg | ORAL_TABLET | Freq: Two times a day (BID) | ORAL | 2 refills | Status: AC

## 2018-02-03 MED ORDER — FENTANYL CITRATE (PF) 100 MCG/2ML IJ SOLN: 25.0000 ug | INTRAMUSCULAR | Status: DC | PRN

## 2018-02-03 MED ORDER — PROPOFOL 10 MG/ML IV BOLUS
INTRAVENOUS | Status: DC | PRN
  Administered 2018-02-03: 70 mg via INTRAVENOUS

## 2018-02-03 MED ORDER — ONDANSETRON HCL 4 MG/2ML IJ SOLN
INTRAMUSCULAR | Status: DC | PRN
  Administered 2018-02-03: 4 mg via INTRAVENOUS

## 2018-02-03 MED ORDER — GLIPIZIDE 5 MG PO TABS: 5.0000 mg | ORAL_TABLET | Freq: Every day | ORAL | 2 refills | Status: AC

## 2018-02-03 MED ORDER — DICYCLOMINE HCL 20 MG PO TABS
20.0000 mg | ORAL_TABLET | Freq: Three times a day (TID) | ORAL | Status: DC
  Filled 2018-02-03: qty 1

## 2018-02-03 MED ORDER — DICYCLOMINE HCL 20 MG PO TABS: 20.0000 mg | ORAL_TABLET | Freq: Three times a day (TID) | ORAL | 0 refills | Status: AC

## 2018-02-03 MED ORDER — LORAZEPAM 2 MG/ML IJ SOLN
1.0000 mg | Freq: Once | INTRAMUSCULAR | Status: AC | PRN
  Administered 2018-02-03: 1 mg via INTRAVENOUS
  Filled 2018-02-03: qty 0.5

## 2018-02-03 MED ORDER — PROPOFOL 500 MG/50ML IV EMUL
INTRAVENOUS | Status: DC | PRN
  Administered 2018-02-03: 140 ug/kg/min via INTRAVENOUS

## 2018-02-03 MED ORDER — LACTATED RINGERS IV SOLN
INTRAVENOUS | Status: DC | PRN
  Administered 2018-02-03: 10:00:00 via INTRAVENOUS

## 2018-02-03 MED ORDER — LORAZEPAM 2 MG/ML IJ SOLN
INTRAMUSCULAR | Status: AC
  Filled 2018-02-03: qty 1

## 2018-02-03 MED ORDER — TRAMADOL HCL 50 MG PO TABS: 50.0000 mg | ORAL_TABLET | Freq: Four times a day (QID) | ORAL | 0 refills | Status: AC | PRN

## 2018-02-03 MED ORDER — OXYCODONE HCL 5 MG/5ML PO SOLN
5.0000 mg | Freq: Once | ORAL | Status: DC | PRN
  Filled 2018-02-03: qty 5

## 2018-02-03 MED ORDER — OXYCODONE HCL 5 MG PO TABS: 5.0000 mg | ORAL_TABLET | Freq: Once | ORAL | Status: DC | PRN

## 2018-02-03 MED ORDER — DOCUSATE SODIUM 100 MG PO CAPS: 100.0000 mg | ORAL_CAPSULE | Freq: Two times a day (BID) | ORAL | 0 refills | Status: AC

## 2018-02-03 MED ORDER — LIDOCAINE HCL (CARDIAC) 20 MG/ML IV SOLN
INTRAVENOUS | Status: DC | PRN
  Administered 2018-02-03: 40 mg via INTRAVENOUS

## 2018-02-03 NOTE — Transfer of Care (Signed)
Immediate Anesthesia Transfer of Care Note  Patient: Penny Carroll  Procedure(s) Performed: COLONOSCOPY (N/A )  Patient Location: PACU  Anesthesia Type:General  Level of Consciousness: awake, alert  and oriented  Airway & Oxygen Therapy: Patient Spontanous Breathing and Patient connected to nasal cannula oxygen  Post-op Assessment: Report given to RN and Post -op Vital signs reviewed and stable  Post vital signs: Reviewed and stable  Last Vitals:  Vitals:   02/03/18 0955 02/03/18 1031  BP: 99/71 108/75  Pulse: 90 (!) 102  Resp: 16 18  Temp:  36.4 C  SpO2: 100% 100%    Last Pain:  Vitals:   02/03/18 0623  TempSrc:   PainSc: 8       Patients Stated Pain Goal: 3 (01/31/18 2056)  Complications: No apparent anesthesia complications

## 2018-02-03 NOTE — Anesthesia Post-op Follow-up Note (Signed)
Anesthesia QCDR form completed.        

## 2018-02-03 NOTE — Anesthesia Postprocedure Evaluation (Signed)
Anesthesia Post Note  Patient: Penny Carroll  Procedure(s) Performed: COLONOSCOPY (N/A )  Patient location during evaluation: PACU Anesthesia Type: General Level of consciousness: awake and alert Pain management: pain level controlled Vital Signs Assessment: post-procedure vital signs reviewed and stable Respiratory status: spontaneous breathing, nonlabored ventilation, respiratory function stable and patient connected to nasal cannula oxygen Cardiovascular status: blood pressure returned to baseline and stable Postop Assessment: no apparent nausea or vomiting Anesthetic complications: no     Last Vitals:  Vitals:   02/03/18 1052 02/03/18 1102  BP: 120/87 123/78  Pulse:    Resp: 13 12  Temp:  36.6 C  SpO2: 100% 100%    Last Pain:  Vitals:   02/03/18 0623  TempSrc:   PainSc: 8                  [name]

## 2018-02-03 NOTE — Interval H&P Note (Signed)
History and Physical Interval Note:  02/03/2018 11:11 AM  Penny FlesherJanae Carroll  has presented today for surgery, with the diagnosis of LUQ pain  The various methods of treatment have been discussed with the patient and family. After consideration of risks, benefits and other options for treatment, the patient has consented to  Procedure(s): COLONOSCOPY (N/A) as a surgical intervention .  The patient's history has been reviewed, patient examined, no change in status, stable for surgery.  I have reviewed the patient's chart and labs.  Questions were answered to the patient's satisfaction.     [place], [employer] [place]

## 2018-02-03 NOTE — Op Note (Addendum)
[facility] Gastroenterology Patient Name: Penny Carroll Procedure Date: 02/03/2018 9:56 AM MRN: [id number] Account #: [id number] Date of Birth: Jan 16, 1976 Admit Type: Inpatient Age: 42 Room: [facility] ENDO ROOM 4 Gender: Female Note Status: Finalized Procedure:            Colonoscopy Indications:          Generalized abdominal pain, Change in bowel habits Providers:            Boykin Nearing. Zalyn Amend MD, MD Medicines:            Propofol per Anesthesia Complications:        No immediate complications. Procedure:            Pre-Anesthesia Assessment:                       - The risks and benefits of the procedure and the                        sedation options and risks were discussed with the                        patient. All questions were answered and informed                        consent was obtained.                       - Patient identification and proposed procedure were                        verified prior to the procedure by the nurse. The                        procedure was verified in the procedure room.                       - ASA Grade Assessment: II - A patient with mild                        systemic disease.                       - After reviewing the risks and benefits, the patient                        was deemed in satisfactory condition to undergo the                        procedure.                       After obtaining informed consent, the colonoscope was                        passed under direct vision. Throughout the procedure,                        the patient's blood pressure, pulse, and oxygen                        saturations were monitored continuously. The  Colonoscope was introduced through the anus and                        advanced to the the cecum, identified by appendiceal                        orifice and ileocecal valve. The colonoscopy was                        performed without difficulty. The  patient tolerated the                        procedure well. The quality of the bowel preparation                        was adequate to identify polyps. The ileocecal valve,                        appendiceal orifice, and rectum were photographed. Findings:      The perianal and digital rectal examinations were normal. Pertinent       negatives include normal sphincter tone and no palpable rectal lesions.      The colon (entire examined portion) appeared normal.      Semi-liquid stool was found in the sigmoid colon and in the descending       colon, making visualization difficult. Lavage of the area was performed       using 50 - 200 mL of sterile water, resulting in clearance with good       visualization.      Non-bleeding internal hemorrhoids were found during retroflexion. The       hemorrhoids were Grade I (internal hemorrhoids that do not prolapse).      The exam was otherwise without abnormality. Impression:           - The entire examined colon is normal.                       - Stool in the sigmoid colon and in the descending                        colon.                       - Non-bleeding internal hemorrhoids.                       - The examination was otherwise normal.                       - No specimens collected. Recommendation:       - Return patient to hospital ward for ongoing care.                       - Resume previous diet.                       - Continue present medications. Procedure Code(s):    --- Professional ---                       [phone], Colonoscopy, flexible; diagnostic, including  collection of specimen(s) by brushing or washing, when                        performed (separate procedure) Diagnosis Code(s):    --- Professional ---                       K64.0, First degree hemorrhoids                       R10.84, Generalized abdominal pain                       R19.4, Change in bowel habit CPT copyright 2016 American Medical  Association. All rights reserved. The codes documented in this report are preliminary and upon coder review may  be revised to meet current compliance requirements. Stanton Kidney MD, MD 02/03/2018 10:28:40 AM This report has been signed electronically. Number of Addenda: 0 Note Initiated On: 02/03/2018 9:56 AM Scope Withdrawal Time: 0 hours 11 minutes 41 seconds  Total Procedure Duration: 0 hours 15 minutes 29 seconds       [facility]

## 2018-02-03 NOTE — Anesthesia Preprocedure Evaluation (Signed)
Anesthesia Evaluation  Patient identified by MRN, date of birth, ID band Patient awake    Reviewed: Allergy & Precautions, H&P , NPO status , Patient's Chart, lab work & pertinent test results  History of Anesthesia Complications Negative for: history of anesthetic complications  Airway Mallampati: III  TM Distance: >3 FB Neck ROM: full    Dental  (+) Chipped, Poor Dentition   Pulmonary neg shortness of breath, Current Smoker,           Cardiovascular Exercise Tolerance: Good hypertension, (-) angina(-) Past MI and (-) DOE      Neuro/Psych negative neurological ROS  negative psych ROS   GI/Hepatic negative GI ROS, Neg liver ROS, neg GERD  ,  Endo/Other  diabetes, Type 2, Insulin Dependent  Renal/GU negative Renal ROS  negative genitourinary   Musculoskeletal   Abdominal   Peds  Hematology negative hematology ROS (+)   Anesthesia Other Findings Past Medical History: No date: Diabetes mellitus without complication (HCC) No date: Hypertension  History reviewed. No pertinent surgical history.  BMI    Body Mass Index:  34.60 kg/m      Reproductive/Obstetrics negative OB ROS                             Anesthesia Physical  Anesthesia Plan  ASA: III  Anesthesia Plan: General   Post-op Pain Management:    Induction: Intravenous  PONV Risk Score and Plan: Propofol infusion and TIVA  Airway Management Planned: Natural Airway and Nasal Cannula  Additional Equipment:   Intra-op Plan:   Post-operative Plan:   Informed Consent: I have reviewed the patients History and Physical, chart, labs and discussed the procedure including the risks, benefits and alternatives for the proposed anesthesia with the patient or authorized representative who has indicated his/her understanding and acceptance.   Dental Advisory Given  Plan Discussed with: Anesthesiologist, CRNA and  Surgeon  Anesthesia Plan Comments: (Patient consented for risks of anesthesia including but not limited to:  - adverse reactions to medications - risk of intubation if required - damage to teeth, lips or other oral mucosa - sore throat or hoarseness - Damage to heart, brain, lungs or loss of life  Patient voiced understanding.)        Anesthesia Quick Evaluation

## 2018-02-04 ENCOUNTER — Encounter: Payer: Self-pay | Admitting: Internal Medicine

## 2018-02-05 ENCOUNTER — Encounter: Payer: Self-pay | Admitting: Gastroenterology

## 2018-02-05 LAB — SURGICAL PATHOLOGY

## 2018-02-05 NOTE — Discharge Summary (Signed)
[facility] [place] [facility]   PATIENT NAME: Penny FlesherJanae Carroll    MR#:  [id number]  DATE OF BIRTH:  06-18-1976  DATE OF ADMISSION:  01/30/2018   ADMITTING PHYSICIAN: Cammy CopaAngela Maier, MD  DATE OF DISCHARGE: 02/03/2018  2:15 PM  PRIMARY CARE PHYSICIAN: System, Pcp Not In   ADMISSION DIAGNOSIS:   Left upper quadrant pain [R10.12]  DISCHARGE DIAGNOSIS:   Active Problems:   Left flank pain   Left upper quadrant pain   SECONDARY DIAGNOSIS:   Past Medical History:  Diagnosis Date  . Diabetes mellitus without complication (HCC)   . Hypertension     HOSPITAL COURSE:   42 year old female with past medical history significant for diabetes, hypertension and hyperlipidemia presents to hospital secondary to abdominal pain  1. Left-sided abdominal pain- musculoskeletal,  - likely has IBS- going through lots of stress CT of the abdomen with no acute findings. No obstruction. Repeat CT abd was done with no significant findings either -received IV fluids and pain medications. Lipase is within normal limits. -The thoracic spine MRI was ordered and negative for any neurological causes - appreciate GI consult - EGD is negative. Colonoscopy normal. Minor internal hemorrhoids -so started on bentyl and outpatient GI f/u  2. Diabetes mellitus- a1c of 10.8- not on any medications at home. -Being discharged on metformin and glipizide  3. Hypertension-well controlled blood pressure without any medications. Monitor  Will be discharged home today    DISCHARGE CONDITIONS:   Guarded  CONSULTS OBTAINED:   Treatment Team:  Toney ReilVanga, Rohini Reddy, MD  DRUG ALLERGIES:   Allergies  Allergen Reactions  . Penicillins   . Reglan [Metoclopramide]    DISCHARGE MEDICATIONS:   Allergies as of 02/03/2018      Reactions   Penicillins    Reglan [metoclopramide]       Medication List    TAKE these medications   dicyclomine 20 MG tablet Commonly known as:  BENTYL Take  1 tablet (20 mg total) by mouth 3 (three) times daily before meals. X 2 weeks   docusate sodium 100 MG capsule Commonly known as:  COLACE Take 1 capsule (100 mg total) by mouth 2 (two) times daily.   glipiZIDE 5 MG tablet Commonly known as:  GLUCOTROL Take 1 tablet (5 mg total) by mouth daily before breakfast.   metFORMIN 1000 MG tablet Commonly known as:  GLUCOPHAGE Take 1 tablet (1,000 mg total) by mouth 2 (two) times daily with a meal.   traMADol 50 MG tablet Commonly known as:  ULTRAM Take 1 tablet (50 mg total) by mouth every 6 (six) hours as needed.        DISCHARGE INSTRUCTIONS:   1. GI f/u in 1-2 weeks  DIET:   Low carb diet  ACTIVITY:   Activity as tolerated  OXYGEN:   Home Oxygen: No.  Oxygen Delivery: room air  DISCHARGE LOCATION:   home   If you experience worsening of your admission symptoms, develop shortness of breath, life threatening emergency, suicidal or homicidal thoughts you must seek medical attention immediately by calling 911 or calling your MD immediately  if symptoms less severe.  You Must read complete instructions/literature along with all the possible adverse reactions/side effects for all the Medicines you take and that have been prescribed to you. Take any new Medicines after you have completely understood and accpet all the possible adverse reactions/side effects.   Please note  You were cared for by a hospitalist during your hospital  stay. If you have any questions about your discharge medications or the care you received while you were in the hospital after you are discharged, you can call the unit and asked to speak with the hospitalist on call if the hospitalist that took care of you is not available. Once you are discharged, your primary care physician will handle any further medical issues. Please note that NO REFILLS for any discharge medications will be authorized once you are discharged, as it is imperative that you return to  your primary care physician (or establish a relationship with a primary care physician if you do not have one) for your aftercare needs so that they can reassess your need for medications and monitor your lab values.    On the day of Discharge:  VITAL SIGNS:   Blood pressure 123/78, pulse (!) 131, temperature 97.8 F (36.6 C), resp. rate 12, height 5\' 5"  (1.651 m), weight 97.8 kg (215 lb 8 oz), SpO2 100 %.  PHYSICAL EXAMINATION:    GENERAL:  42 y.o.-year-old obese patient lying in the bed with no acute distress.  EYES: Pupils equal, round, reactive to light and accommodation. No scleral icterus. Extraocular muscles intact.  HEENT: Head atraumatic, normocephalic. Oropharynx and nasopharynx clear.  NECK:  Supple, no jugular venous distention. No thyroid enlargement, no tenderness.  LUNGS: Normal breath sounds bilaterally, no wheezing, rales,rhonchi or crepitation. No use of accessory muscles of respiration.  CARDIOVASCULAR: S1, S2 normal. No murmurs, rubs, or gallops.  ABDOMEN: Soft, minimal tenderness in LLQ, voluntary guarding, no rigidity or rebound tenderness, non-distended. Bowel sounds present. No organomegaly or mass.  EXTREMITIES: No pedal edema, cyanosis, or clubbing.  NEUROLOGIC: Cranial nerves II through XII are intact. Muscle strength 5/5 in all extremities. Sensation intact. Gait not checked.  PSYCHIATRIC: The patient is alert and oriented x 3.  SKIN: No obvious rash, lesion, or ulcer.   DATA REVIEW:   CBC Recent Labs  Lab 02/03/18 0550  WBC 4.6  HGB 12.8  HCT 37.5  PLT 211    Chemistries  Recent Labs  Lab 01/30/18 1745  02/01/18 0609  NA 137   < > 136  K 3.7   < > 3.7  CL 101   < > 104  CO2 28   < > 24  GLUCOSE 175*   < > 243*  BUN 7   < > 11  CREATININE 0.77   < > 0.76  CALCIUM 9.0   < > 8.7*  AST 24  --   --   ALT 22  --   --   ALKPHOS 77  --   --   BILITOT 0.6  --   --    < > = values in this interval not displayed.     Microbiology Results    No results found for this or any previous visit.  RADIOLOGY:  No results found.   Management plans discussed with the patient, family and they are in agreement.  CODE STATUS:  Code Status History    Date Active Date Inactive Code Status Order ID Comments User Context   01/31/2018 00:46 02/03/2018 17:48 Full Code [id number]  Cammy Copa, MD ED      TOTAL TIME TAKING CARE OF THIS PATIENT: 38 minutes.    Enid Baas M.D on 02/05/2018 at 3:09 PM  Between 7am to 6pm - Pager - [phone]  After 6pm go to [email or web address] - Therapist, nutritional Hospitalists  Office  [phone]  CC: Primary  care physician; System, Pcp Not In   Note: This dictation was prepared with Dragon dictation along with smaller phrase technology. Any transcriptional errors that result from this process are unintentional.

## 2018-05-27 IMAGING — CT CT ABD-PELV W/ CM
2 of 5 series · 16 of 46 positions shown, 18 images · IV contrast (APPLIED)
Comparison: Body CT 01/30/2018

CLINICAL DATA: Left-sided abdominal pain and nausea.

EXAM:
CT ABDOMEN AND PELVIS WITH CONTRAST
TECHNIQUE: Multidetector CT imaging of the abdomen and pelvis was performed
using the standard protocol following bolus administration of
intravenous contrast.
CONTRAST:  100mL P7QX5K-5GG IOPAMIDOL ([id number]) INJECTION 61%

[Series 2: routine abd/pel with · axial · 0.86mm/px · z∈[-1113,-708]mm · 13 of 93 slices shown, 15 images]
[im 6/93  soft-tissue]
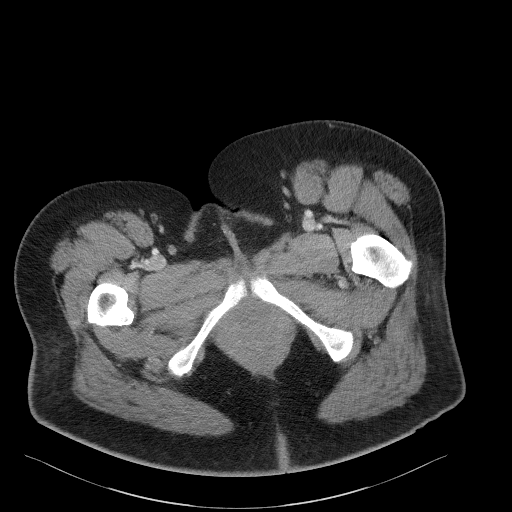
[im 6/93  bone]
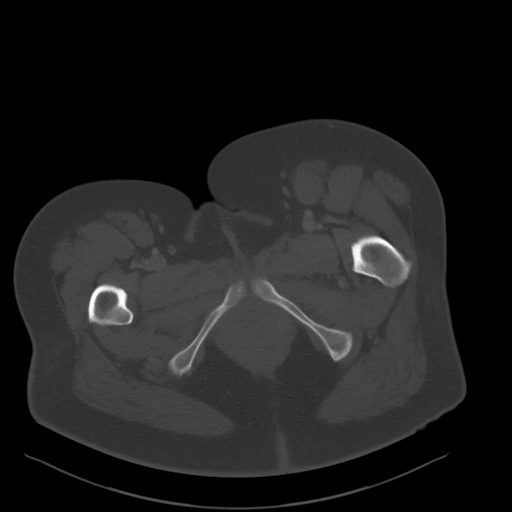
[im 11/93  soft-tissue]
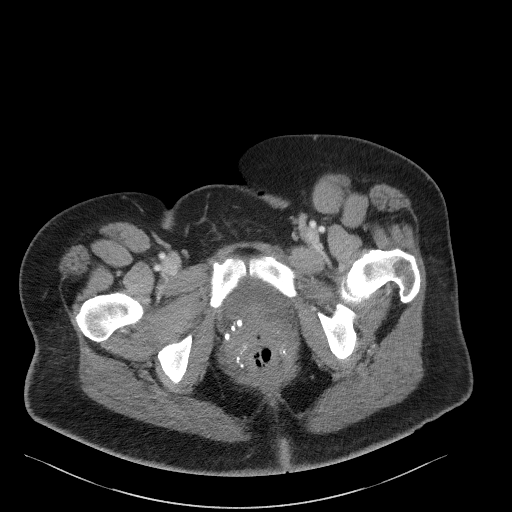
[im 21/93  soft-tissue]
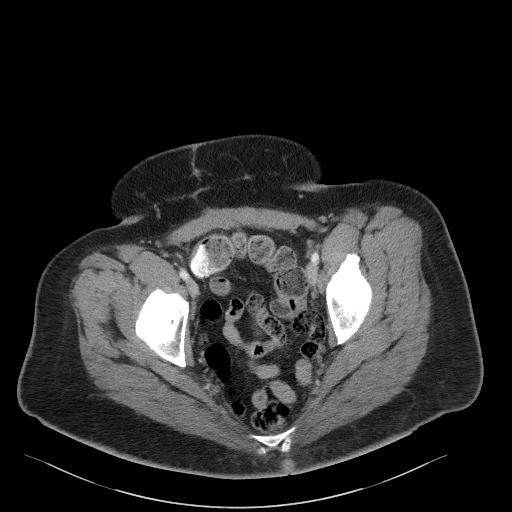
[im 26/93  soft-tissue]
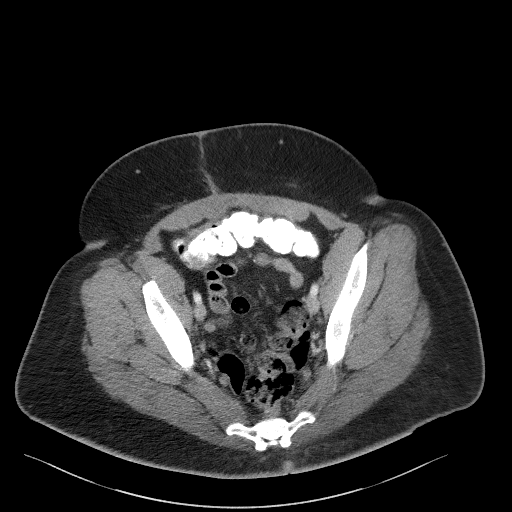
[im 31/93  soft-tissue]
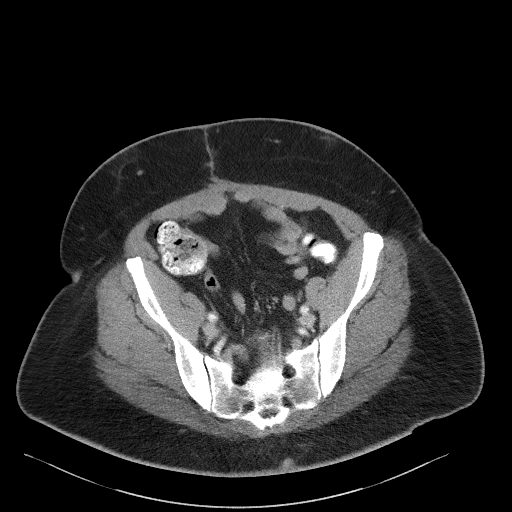
[im 41/93  soft-tissue]
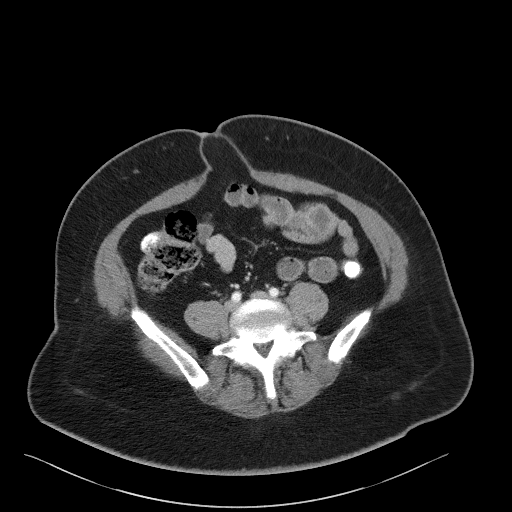
[im 47/93  soft-tissue]
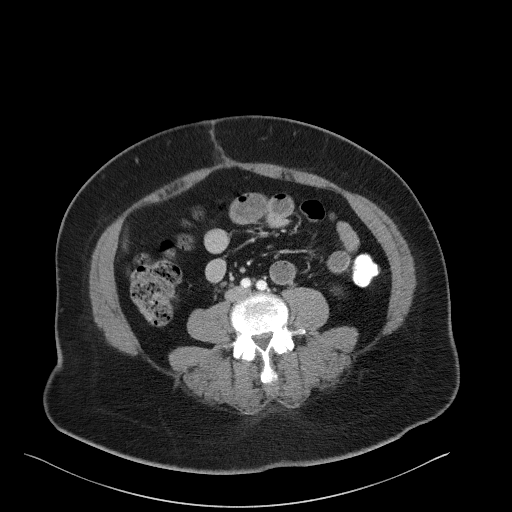
[im 52/93  soft-tissue]
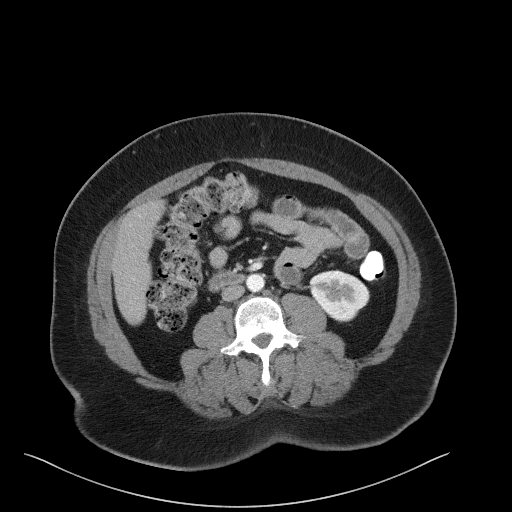
[im 62/93  soft-tissue]
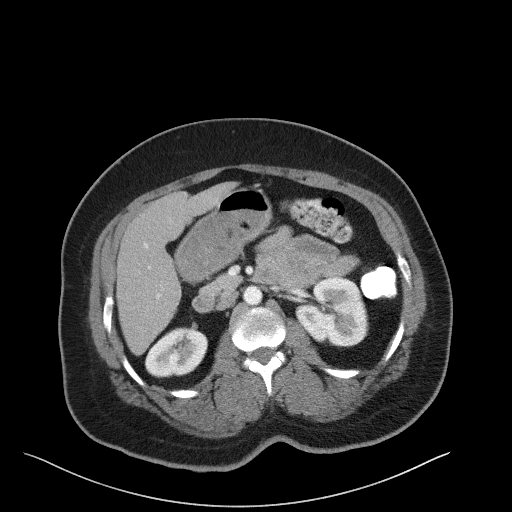
[im 62/93  bone]
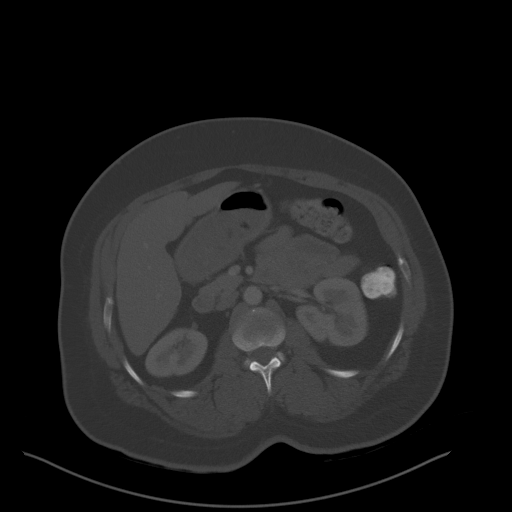
[im 67/93  soft-tissue]
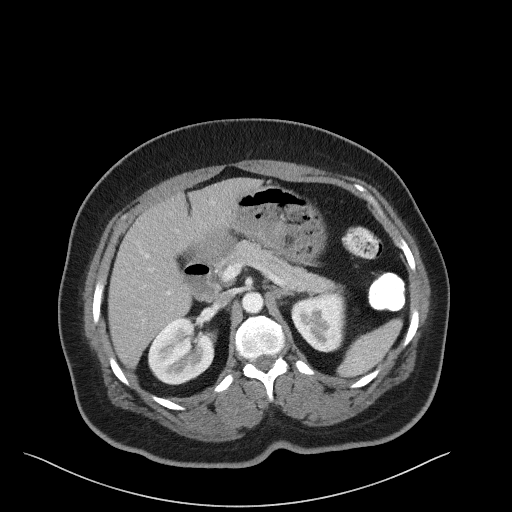
[im 72/93  soft-tissue]
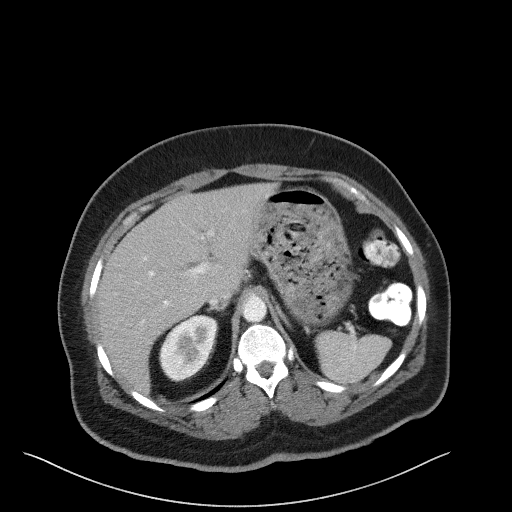
[im 82/93  soft-tissue]
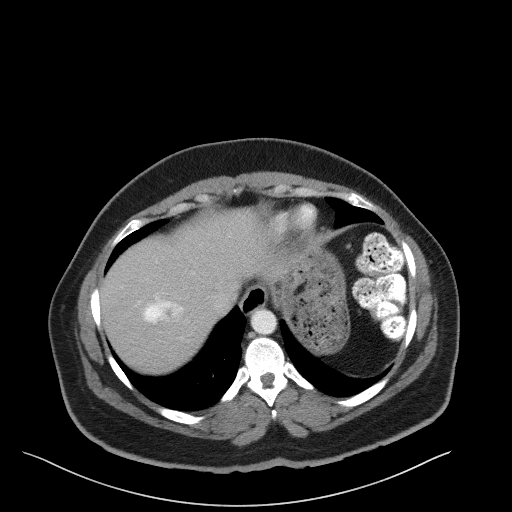
[im 87/93  soft-tissue]
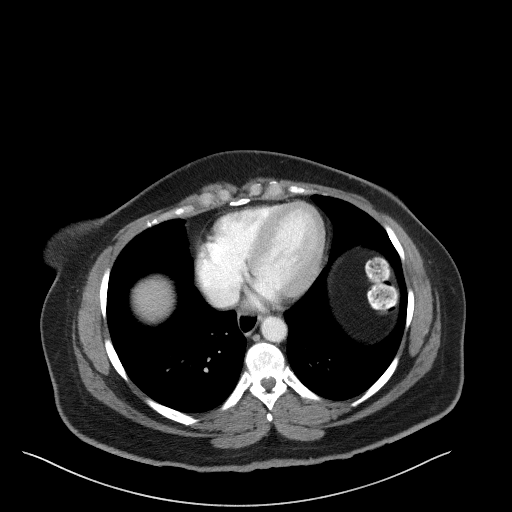

[Series 5: coronal st · coronal · 0.80mm/px · 3 of 100 slices shown]
[im 34/100  soft-tissue]
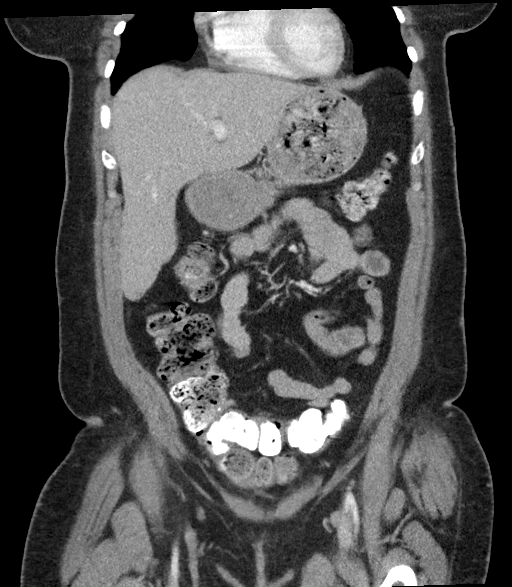
[im 45/100  soft-tissue]
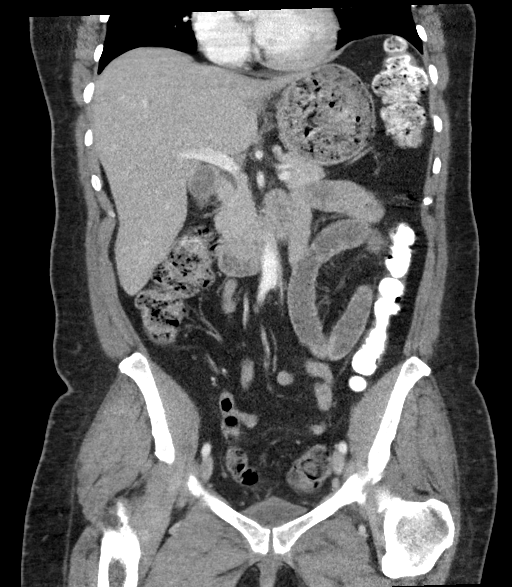
[im 56/100  soft-tissue]
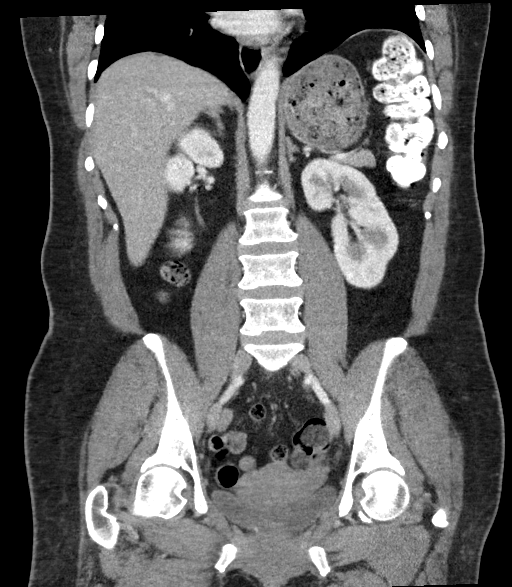

[16 of 46 positions shown; findings below may reference images not displayed]

FINDINGS: Lower chest: Small hiatal hernia.

Hepatobiliary: Hypoattenuated lesion in the right dome of the liver
with discontinuous peripheral enhancement measures 3.6 cm, and
likely represents hemangioma. Postcholecystectomy. No biliary ductal
dilation.

Pancreas: Unremarkable. No pancreatic ductal dilatation or
surrounding inflammatory changes.

Spleen: Normal in size without focal abnormality.

Adrenals/Urinary Tract: Adrenal glands are unremarkable. Kidneys are
normal, without renal calculi, focal lesion, or hydronephrosis.
Bladder is unremarkable.

Stomach/Bowel: Stomach is within normal limits. Appendix not seen.
No evidence of bowel wall thickening, distention, or inflammatory
changes.

Vascular/Lymphatic: No significant vascular findings are present. No
enlarged abdominal or pelvic lymph nodes.

Reproductive: Uterus and bilateral adnexa are unremarkable.

Other: Fat containing periumbilical anterior abdominal wall hernia.
Probable injection sites in the lower anterior abdominal wall.

Musculoskeletal: No acute or significant osseous findings.
IMPRESSION: No acute abnormalities within the abdomen or pelvis.

Small hiatal hernia. Dilation of the distal esophagus may represent
dysmotility or gastroesophageal reflux.

Fat containing periumbilical anterior abdominal wall hernia.

Constipation.
